# Patient Record
Sex: Male | Born: 1950 | Race: White | Hispanic: No | Marital: Married | Smoking: Former smoker
Health system: Southern US, Community
[De-identification: ages and names within clinical notes are randomized; demographics above are authoritative.]

## PROBLEM LIST (undated history)

## (undated) DIAGNOSIS — J449 Chronic obstructive pulmonary disease, unspecified: Secondary | ICD-10-CM

## (undated) DIAGNOSIS — Z8489 Family history of other specified conditions: Secondary | ICD-10-CM

## (undated) DIAGNOSIS — G4730 Sleep apnea, unspecified: Secondary | ICD-10-CM

## (undated) DIAGNOSIS — J45909 Unspecified asthma, uncomplicated: Secondary | ICD-10-CM

## (undated) DIAGNOSIS — J189 Pneumonia, unspecified organism: Secondary | ICD-10-CM

## (undated) DIAGNOSIS — I10 Essential (primary) hypertension: Secondary | ICD-10-CM

## (undated) DIAGNOSIS — K219 Gastro-esophageal reflux disease without esophagitis: Secondary | ICD-10-CM

## (undated) DIAGNOSIS — R0600 Dyspnea, unspecified: Secondary | ICD-10-CM

## (undated) DIAGNOSIS — M1990 Unspecified osteoarthritis, unspecified site: Secondary | ICD-10-CM

## (undated) HISTORY — PX: COLONOSCOPY: SHX174

## (undated) HISTORY — PX: BACK SURGERY: SHX140

## (undated) HISTORY — PX: ANAL FISSURE REPAIR: SHX2312

## (undated) HISTORY — PX: OTHER SURGICAL HISTORY: SHX169

---

## 2018-01-05 ENCOUNTER — Other Ambulatory Visit: Payer: Self-pay | Admitting: Neurosurgery

## 2018-01-05 DIAGNOSIS — M5412 Radiculopathy, cervical region: Secondary | ICD-10-CM

## 2018-01-11 ENCOUNTER — Ambulatory Visit
Admission: RE | Admit: 2018-01-11 | Discharge: 2018-01-11 | Disposition: A | Discharge status: Home / Self Care | Payer: Medicare Other | Source: Ambulatory Visit | Attending: Neurosurgery | Admitting: Neurosurgery

## 2018-01-11 ENCOUNTER — Other Ambulatory Visit: Payer: Self-pay | Admitting: Neurosurgery

## 2018-01-11 DIAGNOSIS — M5412 Radiculopathy, cervical region: Secondary | ICD-10-CM

## 2018-02-09 NOTE — H&P (Signed)
Patient ID:   [id number] Patient: Patrick Waters  Date of Birth: Dec 16, 1950 Visit Type: Office Visit   Date: 01/11/2018 09:00 AM Provider: Danae Orleans. Venetia Maxon MD   This 67 year old male presents for neck pain.   History of Present Illness: 1.  neck pain  Patient returns to review his MRI  I reviewed the patient's MRI of the cervical spine and also consulted with Dr. Danielle Dess to get his opinion.  There is cervical stenosis at C2-3 level with some cord compression but without cord signal abnormality.  In addition there is severe degeneration with anterolisthesis at C6-7 and C7-T1 levels .  He continues to have significant weakness in his right hand grip strength and I believe this is secondary to the foraminal stenosis at C7-T1 on the right.  Currently his right hand grip strength is 4-out of 5.   The patient is complaining of right shoulder and arm pain.  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  I do not believe that at the present time based on his examination , which does not show signs of cervical myelopathy, that it would make sense to do a posterior decompression surgery at the C 2 3 level and think that this should be monitored but not operated on at the present.  This was also Dr. Danielle Dess his opinion although he we both feel that the patient should continue to be followed every 6 months to make sure he does not develop signs of myelopathy.           PAST MEDICAL/SURGICAL HISTORY   (Reviewed, updated)  Disease/disorder Onset Date Management Date Comments    Surgery, lumbar spine 2013     Surgery, cervical spine 1998     Surgery, cervical spine 1991   COPD      Elevated lipids      Hypertension         PAST MEDICAL HISTORY,  SURGICAL HISTORY, FAMILY HISTORY, SOCIAL HISTORY AND REVIEW OF SYSTEMS I have reviewed the patient's past medical, surgical, family and social history as well as the comprehensive review of systems as included on the [facility] history form dated 01/04/2018, which I have signed.  Family History  (Reviewed, updated) Relationship Family Member Name Deceased Age at Death Condition Onset Age Cause of Death      Family history of Diabetes mellitus  N      Family history of Hypertension  N    Social History: Reviewed, no changes.   MEDICATIONS(added, continued or stopped this visit): Started Medication Directions Instruction Stopped   Aspirin Low Dose 81 mg tablet,delayed release take 1 tablet by oral route  every day     Benadryl 25 mg capsule take 2 capsule by oral route  every 4 - 6 hours as needed     Co Q-10 100 mg capsule      Flonase Allergy Relief 50 mcg/actuation nasal spray,suspension spray 1 - 2 spray by intranasal route  every day in each nostril as needed     HyQvia 5 gram/50 mL (10 %) subcutaneous solution      ipratropium-albuterol 0.5 mg-3 mg(2.5 mg base)/3 mL nebulization soln inhale 3 milliliter by nebulization route 4  times every day     levocetirizine 5 mg tablet take 1 tablet by oral route  every day in the evening     lisinopril 20 mg tablet take 1 tablet by oral route  every day     meloxicam 7.5 mg tablet take 1 tablet by oral route  every 2 days     montelukast 10 mg tablet take 1 tablet by oral route  every day in the evening     multivitamin tablet take 1 tablet by oral route  every day     omeprazole 20 mg tablet,delayed release take 1 capsule by oral route  every day    01/04/2018 Percocet 10 mg-325 mg tablet take 1 tablet by oral route  every 6 hours as needed     pravastatin 40 mg tablet take 1 tablet by oral route  every day     Symbicort 160 mcg-4.5 mcg/actuation HFA aerosol inhaler inhale 2 puff by inhalation route 2 times every  day in the morning and evening     Ventolin HFA 90 mcg/actuation aerosol inhaler inhale 2 puff by inhalation route  every 4 - 6 hours as needed     vitamin b12 take 1 tablet by mouth daily     Vitamin D3 5,000 unit tablet      Xolair 150 mg subcutaneous solution inject 1.2 milliliter by subcutaneous route  every 4 weeks       ALLERGIES: Ingredient Reaction Medication Name Comment  NO KNOWN ALLERGIES     No known allergies. Reviewed, no changes.    Vitals Date Temp F BP Pulse Ht In Wt Lb BMI BSA Pain Score  01/11/2018  142/82 72 70 251 36.01  5/10      IMPRESSION Right arm and hand weakness with significant pathology including stenosis and foraminal stenosis at the C6-7 and C7-T1 levels.   Completed Orders (this encounter) Order Details Reason Side Interpretation Result Initial Treatment Date Region  Hypertension education Patient to follow up with primary care provider.        Dietary management education, guidance, and counseling patient encouraged to eat a well balanced diet         Assessment/Plan # Detail Type Description   1. 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       2. Assessment Cervical stenosis of spinal canal (M48.02).   Plan Orders Vista Hard Collar Universal Se.       3. Assessment Cervical radiculopathy (M54.12).       4. Assessment Essential (primary) hypertension (I10).       5. Assessment Body mass index (BMI) 36.0-36.9, adult (Z68.36).   Plan Orders Today's instructions / counseling include(s) Dietary management education, guidance, and counseling.           Pain Management Plan Pain Scale: 5/10. Method: Numeric Pain Intensity Scale. Location: back. Onset: 11/22/2017. Duration: varies. Quality: discomforting. Pain management follow-up plan of care: Patient is taking OTC pain relievers for relief..  I have recommended to the patient that he undergo posterior decompression and fusion C6 through T1  levels.  He has been fitted for a Vista collar.  Risks and benefits were discussed in detail with patient and nurse education was provided.  Surgery scheduled for [facility]/14/2018 at [facility].  He wishes to proceed   Orders: Diagnostic Procedures: Assessment Procedure  M54.12 Cervical Spine- AP/Lat  Instruction(s)/Education: Assessment Instruction  I10 Hypertension education  Z68.36 Dietary management education, guidance, and counseling  Miscellaneous: Assessment  Z61.09 Vista Hard Collar Universal Se             Provider:  Venetia Maxon MD, Danae Orleans 01/14/2018 2:10 PM  Dictation edited by: Danae Orleans. Venetia Maxon    CC Providers: PCP  None   Maeola Harman MD  [address] [place], [place] [address]              Electronically signed by Danae Orleans. Venetia Maxon MD on 01/14/2018 02:10 PM

## 2018-02-13 ENCOUNTER — Other Ambulatory Visit: Payer: Self-pay

## 2018-02-13 ENCOUNTER — Encounter (HOSPITAL_COMMUNITY): Payer: Self-pay | Admitting: *Deleted

## 2018-02-13 NOTE — Progress Notes (Signed)
Mr Patrick Waters denies chest pain or shortness of breath.  Patient reported that he is on an antibiotic ordered by Pulmonologist, Dr Lanelle BalElnaggar at [facility]. Patient had a Echo and Stress Test done 09/2017, at [facility], patient said it was normal.  I have requested results.  I have given the information to Rica MastAngela Kabbe, NP.

## 2018-02-13 NOTE — Progress Notes (Signed)
Anesthesia Chart Review:  Pt is a same day work up.   Pt is a 67 year old male scheduled for C6-T1 posterior decompression and fusion on 02/14/2018 with Patrick HarmanJoseph Stern, MD  Providers:  - Pulmonologist is Patrick LarsenAhmed Elnaggar, MD. Pt reports recent visit during which he was prescribed omnicef (reason is unclear). Records requested.  - Pt saw ID Patrick HintBecky Smith, MD for possible pulmonary fungal infection this past fall (notes in care everywhere). Last office visit 10/24/17 documents "He has had normal CT imaging and his most recent cultures are more suggestive of environmental contaminants. Furthermore, he has had bx without evidence of invasive fungal infection. After discussion with pulmonary, his picture is most consistent with severe asthma and mild CVID which has been better since starting on hyquvia. He has had negative stress test. There are some opportunities for increase compliance with his inhalers and we could consider adding inhaled hypertonic saline to help with pulmonary toilet"   - Pt saw cardiologist Patrick SaleShannon [place]air, MD on 08/03/17 for SOB (notes in care everywhere). Note documents "As he did 2 years ago, he continues to blame most of his shortness of breath when his lung issues. I once again offered repeat stress testing or echocardiogram. These tests have previously been favorable. At this point in time he would like to focus on treatment of his lung disease. He will follow-up with his pulmonologist. I would be happy to proceed with the above-mentioned test if needed".  Pt reports he did have echo and stress test last fall that he says were normal; I am attempting to get records.    PMH includes:  Asthma, possible CVID, GERD. Former smoker (quit 1987). BMI 35.5  Medications include: Albuterol, ASA 81 mg, Symbicort, Omnicef, Mucinex DM, HyQvia, DuoNeb, lisinopril, Xolair, Prilosec, pravastatin.   Labs will be obtained day of surgery.  Nuclear stress test requested.   Echo 09/23/17  requested   Patient will need further assessment by assigned anesthesiologist day of surgery.  If no acute CV symptoms and no signs/symptoms of acute illness, anticipate patient can proceed as scheduled  Patrick Mastngela Trevontae Lindahl, FNP-BC [facility] Phone: [phone] 02/13/2018 4:45 PM

## 2018-02-14 ENCOUNTER — Inpatient Hospital Stay (HOSPITAL_COMMUNITY): Payer: Medicare Other

## 2018-02-14 ENCOUNTER — Inpatient Hospital Stay (HOSPITAL_COMMUNITY)
Admission: RE | Disposition: A | Discharge status: Home / Self Care | Payer: Self-pay | Source: Ambulatory Visit | Attending: Neurosurgery

## 2018-02-14 ENCOUNTER — Inpatient Hospital Stay (HOSPITAL_COMMUNITY)
Admission: RE | Admit: 2018-02-14 | Discharge: 2018-02-15 | DRG: 473 | Disposition: A | Discharge status: Home / Self Care | Payer: Medicare Other | Source: Ambulatory Visit | Attending: Neurosurgery | Admitting: Neurosurgery

## 2018-02-14 DIAGNOSIS — Z7951 Long term (current) use of inhaled steroids: Secondary | ICD-10-CM

## 2018-02-14 DIAGNOSIS — M542 Cervicalgia: Secondary | ICD-10-CM | POA: Diagnosis present

## 2018-02-14 DIAGNOSIS — K219 Gastro-esophageal reflux disease without esophagitis: Secondary | ICD-10-CM | POA: Diagnosis present

## 2018-02-14 DIAGNOSIS — M5412 Radiculopathy, cervical region: Secondary | ICD-10-CM | POA: Diagnosis present

## 2018-02-14 DIAGNOSIS — J45909 Unspecified asthma, uncomplicated: Secondary | ICD-10-CM | POA: Diagnosis present

## 2018-02-14 DIAGNOSIS — M4312 Spondylolisthesis, cervical region: Secondary | ICD-10-CM | POA: Diagnosis present

## 2018-02-14 DIAGNOSIS — M25511 Pain in right shoulder: Secondary | ICD-10-CM | POA: Diagnosis present

## 2018-02-14 DIAGNOSIS — M4802 Spinal stenosis, cervical region: Principal | ICD-10-CM | POA: Diagnosis present

## 2018-02-14 DIAGNOSIS — I10 Essential (primary) hypertension: Secondary | ICD-10-CM | POA: Diagnosis present

## 2018-02-14 DIAGNOSIS — Z419 Encounter for procedure for purposes other than remedying health state, unspecified: Secondary | ICD-10-CM

## 2018-02-14 DIAGNOSIS — Z79899 Other long term (current) drug therapy: Secondary | ICD-10-CM

## 2018-02-14 HISTORY — DX: Dyspnea, unspecified: R06.00

## 2018-02-14 HISTORY — DX: Family history of other specified conditions: Z84.89

## 2018-02-14 HISTORY — DX: Pneumonia, unspecified organism: J18.9

## 2018-02-14 HISTORY — DX: Unspecified osteoarthritis, unspecified site: M19.90

## 2018-02-14 HISTORY — DX: Gastro-esophageal reflux disease without esophagitis: K21.9

## 2018-02-14 HISTORY — DX: Unspecified asthma, uncomplicated: J45.909

## 2018-02-14 HISTORY — PX: POSTERIOR CERVICAL FUSION/FORAMINOTOMY: SHX5038

## 2018-02-14 LAB — CBC
HEMATOCRIT: 42.2 % (ref 39.0–52.0)
HEMOGLOBIN: 14.1 g/dL (ref 13.0–17.0)
MCH: 31.1 pg (ref 26.0–34.0)
MCHC: 33.4 g/dL (ref 30.0–36.0)
MCV: 93.2 fL (ref 78.0–100.0)
Platelets: 220 10*3/uL (ref 150–400)
RBC: 4.53 MIL/uL (ref 4.22–5.81)
RDW: 12.9 % (ref 11.5–15.5)
WBC: 8.3 10*3/uL (ref 4.0–10.5)

## 2018-02-14 LAB — BASIC METABOLIC PANEL
ANION GAP: 11 (ref 5–15)
BUN: 13 mg/dL (ref 6–20)
CHLORIDE: 101 mmol/L (ref 101–111)
CO2: 24 mmol/L (ref 22–32)
Calcium: 8.8 mg/dL — ABNORMAL LOW (ref 8.9–10.3)
Creatinine, Ser: 1.06 mg/dL (ref 0.61–1.24)
GFR calc Af Amer: >60 mL/min (ref >60)
GFR calc non Af Amer: >60 mL/min (ref >60)
GLUCOSE: 112 mg/dL — AB (ref 65–99)
Potassium: 4.1 mmol/L (ref 3.5–5.1)
Sodium: 136 mmol/L (ref 135–145)

## 2018-02-14 LAB — SURGICAL PCR SCREEN
MRSA, PCR: NEGATIVE
Staphylococcus aureus: NEGATIVE

## 2018-02-14 LAB — TYPE AND SCREEN
ABO/RH(D): O POS
ANTIBODY SCREEN: NEGATIVE

## 2018-02-14 LAB — ABO/RH: ABO/RH(D): O POS

## 2018-02-14 SURGERY — POSTERIOR CERVICAL FUSION/FORAMINOTOMY LEVEL 2
Anesthesia: General | Site: Back

## 2018-02-14 MED ORDER — EPHEDRINE SULFATE-NACL 50-0.9 MG/10ML-% IV SOSY
PREFILLED_SYRINGE | INTRAVENOUS | Status: DC | PRN
  Administered 2018-02-14: 10 mg via INTRAVENOUS

## 2018-02-14 MED ORDER — MORPHINE SULFATE (PF) 4 MG/ML IV SOLN
2.0000 mg | INTRAVENOUS | Status: DC | PRN
  Administered 2018-02-14 (×2): 2 mg via INTRAVENOUS
  Filled 2018-02-14 (×2): qty 1

## 2018-02-14 MED ORDER — METHOCARBAMOL 500 MG PO TABS
500.0000 mg | ORAL_TABLET | Freq: Four times a day (QID) | ORAL | Status: DC | PRN
  Administered 2018-02-14 – 2018-02-15 (×2): 500 mg via ORAL
  Filled 2018-02-14: qty 1

## 2018-02-14 MED ORDER — ROCURONIUM BROMIDE 10 MG/ML (PF) SYRINGE
PREFILLED_SYRINGE | INTRAVENOUS | Status: AC
  Filled 2018-02-14: qty 5

## 2018-02-14 MED ORDER — METHOCARBAMOL 500 MG PO TABS
ORAL_TABLET | ORAL | Status: AC
  Filled 2018-02-14: qty 1

## 2018-02-14 MED ORDER — GLYCOPYRROLATE 0.2 MG/ML IV SOSY
PREFILLED_SYRINGE | INTRAVENOUS | Status: DC | PRN
  Administered 2018-02-14: .2 mg via INTRAVENOUS

## 2018-02-14 MED ORDER — LIDOCAINE-EPINEPHRINE 1 %-1:100000 IJ SOLN
INTRAMUSCULAR | Status: DC | PRN
  Administered 2018-02-14: 10 mL

## 2018-02-14 MED ORDER — CEFAZOLIN SODIUM-DEXTROSE 2-4 GM/100ML-% IV SOLN
2.0000 g | Freq: Three times a day (TID) | INTRAVENOUS | Status: AC
  Administered 2018-02-14 – 2018-02-15 (×2): 2 g via INTRAVENOUS
  Filled 2018-02-14 (×2): qty 100

## 2018-02-14 MED ORDER — BUPIVACAINE HCL (PF) 0.5 % IJ SOLN
INTRAMUSCULAR | Status: DC | PRN
  Administered 2018-02-14: 10 mL

## 2018-02-14 MED ORDER — THROMBIN (RECOMBINANT) 5000 UNITS EX SOLR
OROMUCOSAL | Status: DC | PRN
  Administered 2018-02-14: 5 mL

## 2018-02-14 MED ORDER — CEFUROXIME AXETIL 500 MG PO TABS
500.0000 mg | ORAL_TABLET | Freq: Two times a day (BID) | ORAL | Status: DC
  Administered 2018-02-14 – 2018-02-15 (×2): 500 mg via ORAL
  Filled 2018-02-14 (×2): qty 1

## 2018-02-14 MED ORDER — 0.9 % SODIUM CHLORIDE (POUR BTL) OPTIME
TOPICAL | Status: DC | PRN
  Administered 2018-02-14 (×2): 1000 mL

## 2018-02-14 MED ORDER — PROPOFOL 10 MG/ML IV BOLUS
INTRAVENOUS | Status: DC | PRN
  Administered 2018-02-14: 100 mg via INTRAVENOUS
  Administered 2018-02-14: 50 mg via INTRAVENOUS

## 2018-02-14 MED ORDER — CHLORHEXIDINE GLUCONATE CLOTH 2 % EX PADS: 6.0000 | MEDICATED_PAD | Freq: Once | CUTANEOUS | Status: DC

## 2018-02-14 MED ORDER — MIDAZOLAM HCL 2 MG/2ML IJ SOLN
INTRAMUSCULAR | Status: AC
  Filled 2018-02-14: qty 2

## 2018-02-14 MED ORDER — PANTOPRAZOLE SODIUM 40 MG PO TBEC: 40.0000 mg | DELAYED_RELEASE_TABLET | Freq: Every day | ORAL | Status: DC

## 2018-02-14 MED ORDER — DIPHENHYDRAMINE HCL 25 MG PO CAPS
25.0000 mg | ORAL_CAPSULE | Freq: Four times a day (QID) | ORAL | Status: DC | PRN
Start: 2018-02-14 — End: 2018-02-15

## 2018-02-14 MED ORDER — PHENOL 1.4 % MT LIQD: 1.0000 | OROMUCOSAL | Status: DC | PRN

## 2018-02-14 MED ORDER — FENTANYL CITRATE (PF) 250 MCG/5ML IJ SOLN
INTRAMUSCULAR | Status: DC | PRN
  Administered 2018-02-14 (×3): 50 ug via INTRAVENOUS
  Administered 2018-02-14: 100 ug via INTRAVENOUS

## 2018-02-14 MED ORDER — ZOLPIDEM TARTRATE 5 MG PO TABS: 5.0000 mg | ORAL_TABLET | Freq: Every evening | ORAL | Status: DC | PRN

## 2018-02-14 MED ORDER — ACETAMINOPHEN 325 MG PO TABS
ORAL_TABLET | ORAL | Status: AC
  Filled 2018-02-14: qty 2

## 2018-02-14 MED ORDER — OXYCODONE HCL 5 MG PO TABS
5.0000 mg | ORAL_TABLET | ORAL | Status: DC | PRN
  Administered 2018-02-14: 5 mg via ORAL

## 2018-02-14 MED ORDER — CO Q 10 100 MG PO CAPS
100.0000 mg | ORAL_CAPSULE | Freq: Every day | ORAL | Status: DC
Start: 2018-02-14 — End: 2018-02-14

## 2018-02-14 MED ORDER — ONDANSETRON HCL 4 MG/2ML IJ SOLN: 4.0000 mg | Freq: Once | INTRAMUSCULAR | Status: DC | PRN

## 2018-02-14 MED ORDER — CETIRIZINE HCL 10 MG PO TABS
5.0000 mg | ORAL_TABLET | Freq: Every evening | ORAL | Status: DC
  Administered 2018-02-14: 5 mg via ORAL
  Filled 2018-02-14: qty 1

## 2018-02-14 MED ORDER — HEMOSTATIC AGENTS (NO CHARGE) OPTIME
TOPICAL | Status: DC | PRN
  Administered 2018-02-14: 1

## 2018-02-14 MED ORDER — FENTANYL CITRATE (PF) 250 MCG/5ML IJ SOLN
INTRAMUSCULAR | Status: AC
  Filled 2018-02-14: qty 5

## 2018-02-14 MED ORDER — FLUTICASONE PROPIONATE 50 MCG/ACT NA SUSP
1.0000 | Freq: Two times a day (BID) | NASAL | Status: DC
Start: 2018-02-14 — End: 2018-02-15
  Administered 2018-02-14 – 2018-02-15 (×2): 1 via NASAL
  Filled 2018-02-14: qty 16

## 2018-02-14 MED ORDER — IPRATROPIUM-ALBUTEROL 0.5-2.5 (3) MG/3ML IN SOLN
3.0000 mL | Freq: Three times a day (TID) | RESPIRATORY_TRACT | Status: DC
  Administered 2018-02-14 – 2018-02-15 (×3): 3 mL via RESPIRATORY_TRACT
  Filled 2018-02-14 (×3): qty 3

## 2018-02-14 MED ORDER — IMMUNE GLOBULIN-HYALURONIDASE 10 GM/100ML ~~LOC~~ KIT
50.0000 g | PACK | SUBCUTANEOUS | Status: DC
Start: 2018-02-14 — End: 2018-02-14

## 2018-02-14 MED ORDER — ROCURONIUM BROMIDE 10 MG/ML (PF) SYRINGE
PREFILLED_SYRINGE | INTRAVENOUS | Status: DC | PRN
  Administered 2018-02-14: 20 mg via INTRAVENOUS
  Administered 2018-02-14: 50 mg via INTRAVENOUS
  Administered 2018-02-14: 30 mg via INTRAVENOUS
  Administered 2018-02-14: 20 mg via INTRAVENOUS

## 2018-02-14 MED ORDER — PROPOFOL 10 MG/ML IV BOLUS
INTRAVENOUS | Status: AC
  Filled 2018-02-14: qty 20

## 2018-02-14 MED ORDER — DM-GUAIFENESIN ER 30-600 MG PO TB12
1.0000 | ORAL_TABLET | Freq: Two times a day (BID) | ORAL | Status: DC
  Administered 2018-02-14 – 2018-02-15 (×2): 1 via ORAL
  Filled 2018-02-14 (×3): qty 1

## 2018-02-14 MED ORDER — PANTOPRAZOLE SODIUM 40 MG IV SOLR
40.0000 mg | Freq: Every day | INTRAVENOUS | Status: DC
  Administered 2018-02-14: 40 mg via INTRAVENOUS
  Filled 2018-02-14: qty 40

## 2018-02-14 MED ORDER — OXYCODONE-ACETAMINOPHEN 10-325 MG PO TABS: 1.0000 | ORAL_TABLET | Freq: Four times a day (QID) | ORAL | Status: DC | PRN

## 2018-02-14 MED ORDER — ONDANSETRON HCL 4 MG PO TABS: 4.0000 mg | ORAL_TABLET | Freq: Four times a day (QID) | ORAL | Status: DC | PRN

## 2018-02-14 MED ORDER — POLYETHYLENE GLYCOL 3350 17 G PO PACK: 17.0000 g | PACK | Freq: Every day | ORAL | Status: DC | PRN

## 2018-02-14 MED ORDER — DEXAMETHASONE SODIUM PHOSPHATE 10 MG/ML IJ SOLN
INTRAMUSCULAR | Status: DC | PRN
  Administered 2018-02-14: 10 mg via INTRAVENOUS

## 2018-02-14 MED ORDER — ACETAMINOPHEN 650 MG RE SUPP: 650.0000 mg | RECTAL | Status: DC | PRN

## 2018-02-14 MED ORDER — SUGAMMADEX SODIUM 500 MG/5ML IV SOLN
INTRAVENOUS | Status: AC
  Filled 2018-02-14: qty 5

## 2018-02-14 MED ORDER — BUPIVACAINE HCL (PF) 0.5 % IJ SOLN
INTRAMUSCULAR | Status: AC
  Filled 2018-02-14: qty 30

## 2018-02-14 MED ORDER — SODIUM CHLORIDE 0.9% FLUSH
3.0000 mL | Freq: Two times a day (BID) | INTRAVENOUS | Status: DC
  Administered 2018-02-14: 3 mL via INTRAVENOUS

## 2018-02-14 MED ORDER — ASPIRIN EC 81 MG PO TBEC
81.0000 mg | DELAYED_RELEASE_TABLET | Freq: Every day | ORAL | Status: DC
  Administered 2018-02-14 – 2018-02-15 (×2): 81 mg via ORAL
  Filled 2018-02-14 (×2): qty 1

## 2018-02-14 MED ORDER — MENTHOL 3 MG MT LOZG
1.0000 | LOZENGE | OROMUCOSAL | Status: DC | PRN
  Filled 2018-02-14: qty 9

## 2018-02-14 MED ORDER — FENTANYL CITRATE (PF) 100 MCG/2ML IJ SOLN
INTRAMUSCULAR | Status: AC
  Filled 2018-02-14: qty 2

## 2018-02-14 MED ORDER — MOMETASONE FURO-FORMOTEROL FUM 200-5 MCG/ACT IN AERO
2.0000 | INHALATION_SPRAY | Freq: Two times a day (BID) | RESPIRATORY_TRACT | Status: DC
  Administered 2018-02-14 – 2018-02-15 (×2): 2 via RESPIRATORY_TRACT
  Filled 2018-02-14: qty 8.8

## 2018-02-14 MED ORDER — HYDROMORPHONE HCL 1 MG/ML IJ SOLN
0.2500 mg | INTRAMUSCULAR | Status: DC | PRN
  Administered 2018-02-14 (×4): 0.5 mg via INTRAVENOUS

## 2018-02-14 MED ORDER — ALUM & MAG HYDROXIDE-SIMETH 200-200-20 MG/5ML PO SUSP: 30.0000 mL | Freq: Four times a day (QID) | ORAL | Status: DC | PRN

## 2018-02-14 MED ORDER — ACETAMINOPHEN 325 MG PO TABS
650.0000 mg | ORAL_TABLET | ORAL | Status: DC | PRN
  Administered 2018-02-14: 650 mg via ORAL

## 2018-02-14 MED ORDER — BISACODYL 10 MG RE SUPP: 10.0000 mg | Freq: Every day | RECTAL | Status: DC | PRN

## 2018-02-14 MED ORDER — ADULT MULTIVITAMIN LIQUID CH
15.0000 mL | Freq: Every day | ORAL | Status: DC
  Filled 2018-02-14 (×2): qty 15

## 2018-02-14 MED ORDER — LACTATED RINGERS IV SOLN
INTRAVENOUS | Status: DC
  Administered 2018-02-14: 50 mL/h via INTRAVENOUS
  Administered 2018-02-14: 12:00:00 via INTRAVENOUS

## 2018-02-14 MED ORDER — HYDROCODONE-ACETAMINOPHEN 5-325 MG PO TABS: 2.0000 | ORAL_TABLET | ORAL | Status: DC | PRN

## 2018-02-14 MED ORDER — OXYCODONE HCL 5 MG PO TABS
ORAL_TABLET | ORAL | Status: AC
  Filled 2018-02-14: qty 1

## 2018-02-14 MED ORDER — BACITRACIN ZINC 500 UNIT/GM EX OINT
TOPICAL_OINTMENT | CUTANEOUS | Status: AC
  Filled 2018-02-14: qty 28.35

## 2018-02-14 MED ORDER — METHOCARBAMOL 1000 MG/10ML IJ SOLN
500.0000 mg | Freq: Four times a day (QID) | INTRAVENOUS | Status: DC | PRN
  Administered 2018-02-15: 500 mg via INTRAVENOUS
  Filled 2018-02-14: qty 5
  Filled 2018-02-14: qty 550

## 2018-02-14 MED ORDER — THROMBIN (RECOMBINANT) 5000 UNITS EX SOLR
CUTANEOUS | Status: DC | PRN
  Administered 2018-02-14 (×2): 5000 [IU] via TOPICAL

## 2018-02-14 MED ORDER — MEPERIDINE HCL 50 MG/ML IJ SOLN: 6.2500 mg | INTRAMUSCULAR | Status: DC | PRN

## 2018-02-14 MED ORDER — VITAMIN D 1000 UNITS PO TABS
5000.0000 [IU] | ORAL_TABLET | Freq: Every day | ORAL | Status: DC
  Administered 2018-02-15: 5000 [IU] via ORAL
  Filled 2018-02-14 (×3): qty 5

## 2018-02-14 MED ORDER — DIAZEPAM 5 MG PO TABS
ORAL_TABLET | ORAL | Status: AC
  Filled 2018-02-14: qty 1

## 2018-02-14 MED ORDER — LIDOCAINE-EPINEPHRINE 1 %-1:100000 IJ SOLN
INTRAMUSCULAR | Status: AC
  Filled 2018-02-14: qty 1

## 2018-02-14 MED ORDER — KCL IN DEXTROSE-NACL 20-5-0.45 MEQ/L-%-% IV SOLN: INTRAVENOUS | Status: DC

## 2018-02-14 MED ORDER — THROMBIN 5000 UNITS EX SOLR
CUTANEOUS | Status: AC
  Filled 2018-02-14: qty 15000

## 2018-02-14 MED ORDER — ALBUTEROL SULFATE (2.5 MG/3ML) 0.083% IN NEBU: 2.5000 mg | INHALATION_SOLUTION | Freq: Four times a day (QID) | RESPIRATORY_TRACT | Status: DC | PRN

## 2018-02-14 MED ORDER — LIDOCAINE 2% (20 MG/ML) 5 ML SYRINGE
INTRAMUSCULAR | Status: DC | PRN
  Administered 2018-02-14: 100 mg via INTRAVENOUS

## 2018-02-14 MED ORDER — OXYCODONE-ACETAMINOPHEN 5-325 MG PO TABS: 1.0000 | ORAL_TABLET | ORAL | Status: DC | PRN

## 2018-02-14 MED ORDER — DOCUSATE SODIUM 100 MG PO CAPS
100.0000 mg | ORAL_CAPSULE | Freq: Two times a day (BID) | ORAL | Status: DC
  Administered 2018-02-14 – 2018-02-15 (×3): 100 mg via ORAL
  Filled 2018-02-14 (×3): qty 1

## 2018-02-14 MED ORDER — CEFAZOLIN SODIUM-DEXTROSE 2-4 GM/100ML-% IV SOLN
2.0000 g | INTRAVENOUS | Status: AC
  Administered 2018-02-14: 2 g via INTRAVENOUS
  Filled 2018-02-14: qty 100

## 2018-02-14 MED ORDER — VITAMIN B-12 1000 MCG PO TABS
5000.0000 ug | ORAL_TABLET | Freq: Every day | ORAL | Status: DC
  Administered 2018-02-15: 5000 ug via ORAL
  Filled 2018-02-14: qty 5

## 2018-02-14 MED ORDER — BACITRACIN ZINC 500 UNIT/GM EX OINT
TOPICAL_OINTMENT | CUTANEOUS | Status: DC | PRN
  Administered 2018-02-14: 1 via TOPICAL

## 2018-02-14 MED ORDER — LISINOPRIL 20 MG PO TABS
20.0000 mg | ORAL_TABLET | Freq: Every day | ORAL | Status: DC
  Administered 2018-02-14 – 2018-02-15 (×2): 20 mg via ORAL
  Filled 2018-02-14 (×2): qty 1

## 2018-02-14 MED ORDER — HYDROMORPHONE HCL 1 MG/ML IJ SOLN
INTRAMUSCULAR | Status: AC
  Filled 2018-02-14: qty 1

## 2018-02-14 MED ORDER — OXYCODONE HCL 5 MG PO TABS
15.0000 mg | ORAL_TABLET | ORAL | Status: DC | PRN
  Administered 2018-02-14 – 2018-02-15 (×4): 15 mg via ORAL
  Filled 2018-02-14 (×4): qty 3

## 2018-02-14 MED ORDER — ONDANSETRON HCL 4 MG/2ML IJ SOLN: 4.0000 mg | Freq: Four times a day (QID) | INTRAMUSCULAR | Status: DC | PRN

## 2018-02-14 MED ORDER — SUGAMMADEX SODIUM 200 MG/2ML IV SOLN
INTRAVENOUS | Status: DC | PRN
  Administered 2018-02-14: 400 mg via INTRAVENOUS

## 2018-02-14 MED ORDER — SODIUM CHLORIDE 0.9% FLUSH: 3.0000 mL | INTRAVENOUS | Status: DC | PRN

## 2018-02-14 MED ORDER — PHENYLEPHRINE HCL 10 MG/ML IJ SOLN
INTRAVENOUS | Status: DC | PRN
  Administered 2018-02-14: 10 ug/min via INTRAVENOUS

## 2018-02-14 MED ORDER — LIDOCAINE HCL (CARDIAC) 20 MG/ML IV SOLN
INTRAVENOUS | Status: AC
  Filled 2018-02-14: qty 5

## 2018-02-14 MED ORDER — DEXAMETHASONE SODIUM PHOSPHATE 10 MG/ML IJ SOLN
INTRAMUSCULAR | Status: AC
  Filled 2018-02-14: qty 1

## 2018-02-14 MED ORDER — DIAZEPAM 5 MG PO TABS
5.0000 mg | ORAL_TABLET | Freq: Four times a day (QID) | ORAL | Status: DC | PRN
  Administered 2018-02-14 – 2018-02-15 (×3): 5 mg via ORAL
  Filled 2018-02-14 (×2): qty 1

## 2018-02-14 MED ORDER — EPHEDRINE 5 MG/ML INJ
INTRAVENOUS | Status: AC
  Filled 2018-02-14: qty 10

## 2018-02-14 MED ORDER — METHYLPREDNISOLONE ACETATE 80 MG/ML IJ SUSP
INTRAMUSCULAR | Status: AC
  Filled 2018-02-14: qty 1

## 2018-02-14 MED ORDER — PRAVASTATIN SODIUM 40 MG PO TABS
40.0000 mg | ORAL_TABLET | Freq: Every evening | ORAL | Status: DC
  Administered 2018-02-14: 40 mg via ORAL
  Filled 2018-02-14: qty 1

## 2018-02-14 MED ORDER — MELOXICAM 7.5 MG PO TABS
7.5000 mg | ORAL_TABLET | Freq: Every evening | ORAL | Status: DC
  Administered 2018-02-14: 7.5 mg via ORAL
  Filled 2018-02-14: qty 1

## 2018-02-14 MED ORDER — MONTELUKAST SODIUM 10 MG PO TABS
10.0000 mg | ORAL_TABLET | Freq: Every evening | ORAL | Status: DC
  Administered 2018-02-14: 10 mg via ORAL
  Filled 2018-02-14: qty 1

## 2018-02-14 MED ORDER — ONDANSETRON HCL 4 MG/2ML IJ SOLN
INTRAMUSCULAR | Status: AC
  Filled 2018-02-14: qty 2

## 2018-02-14 MED ORDER — ESMOLOL HCL 100 MG/10ML IV SOLN
INTRAVENOUS | Status: AC
  Filled 2018-02-14: qty 10

## 2018-02-14 MED ORDER — ONDANSETRON HCL 4 MG/2ML IJ SOLN
INTRAMUSCULAR | Status: DC | PRN
  Administered 2018-02-14: 4 mg via INTRAVENOUS

## 2018-02-14 MED ORDER — MIDAZOLAM HCL 2 MG/2ML IJ SOLN
INTRAMUSCULAR | Status: DC | PRN
  Administered 2018-02-14: 2 mg via INTRAVENOUS

## 2018-02-14 SURGICAL SUPPLY — 84 items
ADH SKN CLS APL DERMABOND .7 (GAUZE/BANDAGES/DRESSINGS) ×1
BIT DRILL NEURO 2X3.1 SFT TUCH (MISCELLANEOUS) IMPLANT
BIT DRILL VUEPOINT II (BIT) IMPLANT
BLADE CLIPPER SURG (BLADE) IMPLANT
BLADE SURG 11 STRL SS (BLADE) IMPLANT
BLADE ULTRA TIP 2M (BLADE) IMPLANT
BUR PRECISION FLUTE 5.0 (BURR) ×2 IMPLANT
CANISTER SUCT 3000ML PPV (MISCELLANEOUS) ×3 IMPLANT
CARTRIDGE OIL MAESTRO DRILL (MISCELLANEOUS) ×1 IMPLANT
DECANTER SPIKE VIAL GLASS SM (MISCELLANEOUS) ×3 IMPLANT
DERMABOND ADVANCED (GAUZE/BANDAGES/DRESSINGS) ×2
DERMABOND ADVANCED .7 DNX12 (GAUZE/BANDAGES/DRESSINGS) IMPLANT
DIFFUSER DRILL AIR PNEUMATIC (MISCELLANEOUS) ×3 IMPLANT
DRAPE C-ARM 42X72 X-RAY (DRAPES) ×6 IMPLANT
DRAPE C-ARMOR (DRAPES) ×2 IMPLANT
DRAPE LAPAROTOMY 100X72 PEDS (DRAPES) ×3 IMPLANT
DRAPE MICROSCOPE LEICA (MISCELLANEOUS) ×2 IMPLANT
DRILL BIT VUEPOINT II (BIT) ×3
DRILL NEURO 2X3.1 SOFT TOUCH (MISCELLANEOUS)
DRSG OPSITE POSTOP 4X6 (GAUZE/BANDAGES/DRESSINGS) ×2 IMPLANT
DRSG OPSITE POSTOP 4X8 (GAUZE/BANDAGES/DRESSINGS) ×2 IMPLANT
DRSG PAD ABDOMINAL 8X10 ST (GAUZE/BANDAGES/DRESSINGS) IMPLANT
DURAPREP 6ML APPLICATOR 50/CS (WOUND CARE) ×3 IMPLANT
ELECT BLADE 4.0 EZ CLEAN MEGAD (MISCELLANEOUS) ×3
ELECT REM PT RETURN 9FT ADLT (ELECTROSURGICAL) ×3
ELECTRODE BLDE 4.0 EZ CLN MEGD (MISCELLANEOUS) IMPLANT
ELECTRODE REM PT RTRN 9FT ADLT (ELECTROSURGICAL) ×1 IMPLANT
GAUZE SPONGE 4X4 12PLY STRL (GAUZE/BANDAGES/DRESSINGS) ×3 IMPLANT
GAUZE SPONGE 4X4 16PLY XRAY LF (GAUZE/BANDAGES/DRESSINGS) IMPLANT
GLOVE BIO SURGEON STRL SZ8 (GLOVE) ×3 IMPLANT
GLOVE BIOGEL PI IND STRL 6.5 (GLOVE) IMPLANT
GLOVE BIOGEL PI IND STRL 8 (GLOVE) ×1 IMPLANT
GLOVE BIOGEL PI IND STRL 8.5 (GLOVE) ×1 IMPLANT
GLOVE BIOGEL PI INDICATOR 6.5 (GLOVE) ×2
GLOVE BIOGEL PI INDICATOR 8 (GLOVE) ×8
GLOVE BIOGEL PI INDICATOR 8.5 (GLOVE) ×2
GLOVE ECLIPSE 7.5 STRL STRAW (GLOVE) ×8 IMPLANT
GLOVE ECLIPSE 8.0 STRL XLNG CF (GLOVE) ×3 IMPLANT
GLOVE EXAM NITRILE LRG STRL (GLOVE) IMPLANT
GLOVE EXAM NITRILE XL STR (GLOVE) IMPLANT
GLOVE EXAM NITRILE XS STR PU (GLOVE) IMPLANT
GOWN STRL REUS W/ TWL LRG LVL3 (GOWN DISPOSABLE) IMPLANT
GOWN STRL REUS W/ TWL XL LVL3 (GOWN DISPOSABLE) IMPLANT
GOWN STRL REUS W/TWL 2XL LVL3 (GOWN DISPOSABLE) ×4 IMPLANT
GOWN STRL REUS W/TWL LRG LVL3 (GOWN DISPOSABLE)
GOWN STRL REUS W/TWL XL LVL3 (GOWN DISPOSABLE) ×6
HEMOSTAT POWDER KIT SURGIFOAM (HEMOSTASIS) ×2 IMPLANT
HEMOSTAT SURGICEL 2X14 (HEMOSTASIS) IMPLANT
KIT BASIN OR (CUSTOM PROCEDURE TRAY) ×3 IMPLANT
KIT ROOM TURNOVER OR (KITS) ×3 IMPLANT
MARKER SKIN DUAL TIP RULER LAB (MISCELLANEOUS) ×3 IMPLANT
NDL HYPO 18GX1.5 BLUNT FILL (NEEDLE) IMPLANT
NDL HYPO 25X1 1.5 SAFETY (NEEDLE) ×1 IMPLANT
NDL SPNL 22GX3.5 QUINCKE BK (NEEDLE) ×1 IMPLANT
NEEDLE HYPO 18GX1.5 BLUNT FILL (NEEDLE) IMPLANT
NEEDLE HYPO 25X1 1.5 SAFETY (NEEDLE) ×3 IMPLANT
NEEDLE SPNL 22GX3.5 QUINCKE BK (NEEDLE) ×3 IMPLANT
NS IRRIG 1000ML POUR BTL (IV SOLUTION) ×3 IMPLANT
OIL CARTRIDGE MAESTRO DRILL (MISCELLANEOUS) ×3
PACK LAMINECTOMY NEURO (CUSTOM PROCEDURE TRAY) ×3 IMPLANT
PIN MAYFIELD SKULL DISP (PIN) ×3 IMPLANT
PIN SKULL STERILE RADIO DISP (PIN) ×2 IMPLANT
PUTTY BONE ATTRAX 5CC STRIP (Putty) ×2 IMPLANT
ROD 120MM (Rod) ×3 IMPLANT
ROD SPNL 120X3.5XNS LF TI (Rod) IMPLANT
RUBBERBAND STERILE (MISCELLANEOUS) ×4 IMPLANT
SCREW MA MM 3.5X12 (Screw) ×8 IMPLANT
SCREW SET THREADED (Screw) ×12 IMPLANT
SCREW VUEPOINT II 4.0X30MM MA (Screw) ×4 IMPLANT
SPONGE INTESTINAL PEANUT (DISPOSABLE) IMPLANT
SPONGE SURGIFOAM ABS GEL SZ50 (HEMOSTASIS) ×3 IMPLANT
STAPLER SKIN PROX WIDE 3.9 (STAPLE) ×3 IMPLANT
SUT ETHILON 3 0 FSL (SUTURE) ×3 IMPLANT
SUT VIC AB 0 CT1 18XCR BRD8 (SUTURE) ×1 IMPLANT
SUT VIC AB 0 CT1 8-18 (SUTURE) ×3
SUT VIC AB 2-0 CP2 18 (SUTURE) ×3 IMPLANT
SUT VIC AB 2-0 CT1 18 (SUTURE) ×2 IMPLANT
SUT VIC AB 3-0 SH 8-18 (SUTURE) ×4 IMPLANT
SYR 3ML LL SCALE MARK (SYRINGE) IMPLANT
TOWEL GREEN STERILE (TOWEL DISPOSABLE) ×3 IMPLANT
TOWEL GREEN STERILE FF (TOWEL DISPOSABLE) ×3 IMPLANT
TRAY FOLEY W/METER SILVER 16FR (SET/KITS/TRAYS/PACK) ×2 IMPLANT
UNDERPAD 30X30 (UNDERPADS AND DIAPERS) ×3 IMPLANT
WATER STERILE IRR 1000ML POUR (IV SOLUTION) ×3 IMPLANT

## 2018-02-14 NOTE — Anesthesia Preprocedure Evaluation (Signed)
Anesthesia Evaluation  Patient identified by MRN, date of birth, ID band Patient awake    Reviewed: Allergy & Precautions, NPO status , Patient's Chart, lab work & pertinent test results  Airway Mallampati: I  TM Distance: >3 FB Neck ROM: Full    Dental   Pulmonary asthma , former smoker,    Pulmonary exam normal        Cardiovascular Normal cardiovascular exam     Neuro/Psych    GI/Hepatic GERD  Medicated and Controlled,  Endo/Other    Renal/GU      Musculoskeletal   Abdominal   Peds  Hematology   Anesthesia Other Findings   Reproductive/Obstetrics                             Anesthesia Physical Anesthesia Plan  ASA: II  Anesthesia Plan: General   Post-op Pain Management:    Induction:   PONV Risk Score and Plan: 2 and Dexamethasone, Ondansetron and Midazolam  Airway Management Planned: Oral ETT  Additional Equipment:   Intra-op Plan:   Post-operative Plan: Extubation in OR  Informed Consent: I have reviewed the patients History and Physical, chart, labs and discussed the procedure including the risks, benefits and alternatives for the proposed anesthesia with the patient or authorized representative who has indicated his/her understanding and acceptance.     Plan Discussed with: CRNA and Surgeon  Anesthesia Plan Comments:         Anesthesia Quick Evaluation

## 2018-02-14 NOTE — Op Note (Signed)
02/14/2018  1:24 PM  PATIENT:  Patrick Waters  67 y.o. 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, foraminal stenosis, radiculopathy, cervicalgia  PROCEDURE:  Procedure(s): Cervical six-Thoracic one Posterior decompression and fusion (N/A) with lateral mass screws C 6, C 7 and pedicle screws T 1 with foraminotomy and microdissection  SURGEON:  Surgeon(s) and Role:    Maeola Harman* Handsome Anglin, MD - Primary    * Julio SicksPool, Henry, MD - Assisting  PHYSICIAN ASSISTANT:   ASSISTANTS: Poteat, RN   ANESTHESIA:   general  EBL:  200 mL   BLOOD ADMINISTERED:none  DRAINS: none   LOCAL MEDICATIONS USED:  MARCAINE    and LIDOCAINE   SPECIMEN:  No Specimen  DISPOSITION OF SPECIMEN:  N/A  COUNTS:  YES  TOURNIQUET:  * No tourniquets in log *  DICTATION: Patient is 67 year old man with spondylolisthesis C 7 T 1 with foraminal stenosis and right upper extremity weakness, cervical stenosis, and neck pain.  It was elected to take the patient to surgery for posterior cervical decompression and fusion, given patient's large size, it was felt this would not be possible from anterior approach.  PROCEDURE: Patient was brought to the OR and following smooth and uncomplicated induction of GETA using Glide Scope, patient was placed in 3 pin fixation and rolled into a prone position on the OR table with radiolucent headholder.  Posterior neck was shaved with clippers, prepped and draped in the usual fashion with betadine scrub followed by Duraprep.  Area of planned incision was infiltrated with local lidocaine.  Incision was made from C5-T1 and carried through the avascular midline plane to expose these lavels and their lateral masses.  There was significant arthritis at each of these levels with facet arthropathy and bony excrescences. Pedicle  screws were placed at T 1 bilaterally (30 x 4 mm) after AP and lateral localizing X rays.   Lateral mass screws were placed from C6 to C7 bilaterally according to standard landmarks and their positioning was confirmed with fluoroscopy (3.5 x 12 mm).  The facet joints and laminae were decorticated.  Screws and rods were locked down in situ.  A generous foraminotomy was performed with the high speed drill and with the microscope on the right at the C 7 T 1 level.  l The spinal cord dura appeared to be pulsatile and the C 8 nerve root was widely decompressed. Rods were placed bilaterally and locked down in situ.  Posterolateral region was packed with local autograft and 5 cc Attrax.   Hemostasis was assured  Wound was closed with 0, 2-0, and 3-0 vicryl sutures and staples were used to re approximate the skin edges. Sterile occlusive dressing was placed.  Patient was taken out of pins and turned back onto the OR table.  Patient was extubated in the operating room and taken to recovery in stable and satisfactory condition.  Counts were correct at the end of the case.  PLAN OF CARE: Admit to inpatient   PATIENT DISPOSITION:  PACU - hemodynamically stable.   Delay start of Pharmacological VTE agent (>24hrs) due to surgical blood loss or risk of bleeding: yes

## 2018-02-14 NOTE — Transfer of Care (Signed)
Immediate Anesthesia Transfer of Care Note  Patient: Patrick Waters  Procedure(s) Performed: Cervical six-Thoracic one Posterior decompression and fusion (N/A Back)  Patient Location: PACU  Anesthesia Type:General  Level of Consciousness: awake, alert  and oriented  Airway & Oxygen Therapy: Patient Spontanous Breathing and Patient connected to nasal cannula oxygen  Post-op Assessment: Report given to RN and Post -op Vital signs reviewed and stable  Post vital signs: Reviewed and stable  Last Vitals:  Vitals Value Taken Time  BP 156/79 02/14/2018  1:30 PM  Temp 36.2 C 02/14/2018  1:30 PM  Pulse 78 02/14/2018  1:40 PM  Resp 13 02/14/2018  1:40 PM  SpO2 100 % 02/14/2018  1:40 PM  Vitals shown include unvalidated device data.  Last Pain:  Vitals:   02/14/18 1330  TempSrc:   PainSc: 8       Patients Stated Pain Goal: 3 (02/14/18 0845)  Complications: No apparent anesthesia complications

## 2018-02-14 NOTE — Progress Notes (Signed)
Awake, alert, conversant.  MAEW with full strength.  Improved right HI.  Right arm pain is better.  Neck is sore.  Doing well.

## 2018-02-14 NOTE — Interval H&P Note (Signed)
History and Physical Interval Note:  02/14/2018 9:14 AM  Patrick Waters  has presented today for surgery, with the diagnosis of Cervical stenosis of spinal canal  The various methods of treatment have been discussed with the patient and family. After consideration of risks, benefits and other options for treatment, the patient has consented to  Procedure(s) with comments: C6-T1 Posterior decompression and fusion (N/A) - C6-T1 Posterior decompression and fusion as a surgical intervention .  The patient's history has been reviewed, patient examined, no change in status, stable for surgery.  I have reviewed the patient's chart and labs.  Questions were answered to the patient's satisfaction.     Ulla Mckiernan D

## 2018-02-14 NOTE — Brief Op Note (Signed)
02/14/2018  1:24 PM  PATIENT:  Patrick Waters  67 y.o. 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, foraminal stenosis, radiculopathy, cervicalgia  PROCEDURE:  Procedure(s): Cervical six-Thoracic one Posterior decompression and fusion (N/A) with lateral mass screws C 6, C 7 and pedicle screws T 1 with foraminotomy and microdissection  SURGEON:  Surgeon(s) and Role:    * Seng Fouts, MD - Primary    * Pool, Henry, MD - Assisting  PHYSICIAN ASSISTANT:   ASSISTANTS: Poteat, RN   ANESTHESIA:   general  EBL:  200 mL   BLOOD ADMINISTERED:none  DRAINS: none   LOCAL MEDICATIONS USED:  MARCAINE    and LIDOCAINE   SPECIMEN:  No Specimen  DISPOSITION OF SPECIMEN:  N/A  COUNTS:  YES  TOURNIQUET:  * No tourniquets in log *  DICTATION: Patient is 67 year old man with spondylolisthesis C 7 T 1 with foraminal stenosis and right upper extremity weakness, cervical stenosis, and neck pain.  It was elected to take the patient to surgery for posterior cervical decompression and fusion, given patient's large size, it was felt this would not be possible from anterior approach.  PROCEDURE: Patient was brought to the OR and following smooth and uncomplicated induction of GETA using Glide Scope, patient was placed in 3 pin fixation and rolled into a prone position on the OR table with radiolucent headholder.  Posterior neck was shaved with clippers, prepped and draped in the usual fashion with betadine scrub followed by Duraprep.  Area of planned incision was infiltrated with local lidocaine.  Incision was made from C5-T1 and carried through the avascular midline plane to expose these lavels and their lateral masses.  There was significant arthritis at each of these levels with facet arthropathy and bony excrescences. Pedicle  screws were placed at T 1 bilaterally (30 x 4 mm) after AP and lateral localizing X rays.   Lateral mass screws were placed from C6 to C7 bilaterally according to standard landmarks and their positioning was confirmed with fluoroscopy (3.5 x 12 mm).  The facet joints and laminae were decorticated.  Screws and rods were locked down in situ.  A generous foraminotomy was performed with the high speed drill and with the microscope on the right at the C 7 T 1 level.  l The spinal cord dura appeared to be pulsatile and the C 8 nerve root was widely decompressed. Rods were placed bilaterally and locked down in situ.  Posterolateral region was packed with local autograft and 5 cc Attrax.   Hemostasis was assured  Wound was closed with 0, 2-0, and 3-0 vicryl sutures and staples were used to re approximate the skin edges. Sterile occlusive dressing was placed.  Patient was taken out of pins and turned back onto the OR table.  Patient was extubated in the operating room and taken to recovery in stable and satisfactory condition.  Counts were correct at the end of the case.  PLAN OF CARE: Admit to inpatient   PATIENT DISPOSITION:  PACU - hemodynamically stable.   Delay start of Pharmacological VTE agent (>24hrs) due to surgical blood loss or risk of bleeding: yes  

## 2018-02-14 NOTE — Anesthesia Postprocedure Evaluation (Signed)
Anesthesia Post Note  Patient: Driscilla MoatsRonnie D Connett  Procedure(s) Performed: Cervical six-Thoracic one Posterior decompression and fusion (N/A Back)     Patient location during evaluation: PACU Anesthesia Type: General Level of consciousness: awake and alert Pain management: pain level controlled Vital Signs Assessment: post-procedure vital signs reviewed and stable Respiratory status: spontaneous breathing, nonlabored ventilation, respiratory function stable and patient connected to nasal cannula oxygen Cardiovascular status: blood pressure returned to baseline and stable Postop Assessment: no apparent nausea or vomiting Anesthetic complications: no    Last Vitals:  Vitals:   02/14/18 1515 02/14/18 1605  BP: (!) 146/71   Pulse: 87 93  Resp: 18 18  Temp: 36.9 C   SpO2: 97% 96%    Last Pain:  Vitals:   02/14/18 1720  TempSrc:   PainSc: 8                  Joyanna Kleman DAVID

## 2018-02-14 NOTE — Anesthesia Procedure Notes (Signed)
Procedure Name: Intubation Date/Time: 02/14/2018 10:50 AM Performed by: Samara DeistBeckner, Karryn Kosinski B, CRNA Pre-anesthesia Checklist: Patient identified, Emergency Drugs available, Suction available, Patient being monitored and Timeout performed Patient Re-evaluated:Patient Re-evaluated prior to induction Oxygen Delivery Method: Circle system utilized Preoxygenation: Pre-oxygenation with 100% oxygen Induction Type: IV induction Ventilation: Mask ventilation without difficulty Laryngoscope Size: Glidescope and 4 Grade View: Grade I Tube type: Oral Tube size: 7.5 mm Number of attempts: 1 Airway Equipment and Method: Stylet and Video-laryngoscopy Placement Confirmation: ETT inserted through vocal cords under direct vision,  positive ETCO2 and breath sounds checked- equal and bilateral Secured at: 22 cm Tube secured with: Tape Dental Injury: Teeth and Oropharynx as per pre-operative assessment  Difficulty Due To: Difficult Airway-  due to neck instability Comments: Surgeon requests video laryngoscopy, neck maintained neutral for intubation

## 2018-02-14 NOTE — Progress Notes (Signed)
Patient ID: Frutoso SchatzRonnie D Waters, male   DOB: 08/14/1951, 67 y.o.   MRN: [id number] Alert, conversant. Reports significant posterior cervical pain, but improved (decreased) since PACU. No right arm pain as pre-op. Good strength BUE. Vista collar in use.

## 2018-02-15 ENCOUNTER — Encounter (HOSPITAL_COMMUNITY): Payer: Self-pay | Admitting: Neurosurgery

## 2018-02-15 MED ORDER — OXYCODONE-ACETAMINOPHEN 10-325 MG PO TABS: 1.0000 | ORAL_TABLET | Freq: Four times a day (QID) | ORAL | 0 refills | Status: AC | PRN

## 2018-02-15 MED FILL — Thrombin For Soln 5000 Unit: CUTANEOUS | Qty: 2 | Status: AC

## 2018-02-15 MED FILL — Thrombin For Soln 5000 Unit: CUTANEOUS | Qty: 5000 | Status: AC

## 2018-02-15 NOTE — Progress Notes (Addendum)
Subjective: Patient reports "There's still pain, but it's much better"  Objective: Vital signs in last 24 hours: Temp:  [97.2 F (36.2 C)-98.4 F (36.9 C)] 98.1 F (36.7 C) (03/27 0800) Pulse Rate:  [78-101] 78 (03/27 0800) Resp:  [12-21] 18 (03/27 0800) BP: (125-173)/(71-86) 125/77 (03/27 0800) SpO2:  [96 %-100 %] 99 % (03/27 0800)  Intake/Output from previous day: 03/26 0701 - 03/27 0700 In: 2160 [P.O.:360; I.V.:1700; IV Piggyback:100] Out: 200 [Blood:200] Intake/Output this shift: No intake/output data recorded.  Alert, sitting at edge of bed eating breakfast. Wife present. Incisional pain well controlled with Oxy 15 and Robaxin. Good strength BUE. Drsg dry - no erythema or drainage beneath honeycomb. Vista collar in use.   Lab Results: Recent Labs    02/14/18 0834  WBC 8.3  HGB 14.1  HCT 42.2  PLT 220   BMET Recent Labs    02/14/18 0834  NA 136  K 4.1  CL 101  CO2 24  GLUCOSE 112*  BUN 13  CREATININE 1.06  CALCIUM 8.8*    Studies/Results: Dg Cervical Spine 1 View  Result Date: 02/14/2018 CLINICAL DATA:  Cervical fusion. EXAM: DG C-ARM 61-120 MIN; DG CERVICAL SPINE - 1 VIEW COMPARISON:  MRI 01/11/2018. FINDINGS: Pedicle screws noted over the lower cervical spine on single AP view. Endotracheal tube and NG tube noted. 0 minutes 23 seconds fluoroscopy time utilized. IMPRESSION: Postsurgical changes lower cervical spine. Electronically Signed   By: Maisie Fushomas  Register   On: 02/14/2018 12:48   Dg C-arm 1-60 Min  Result Date: 02/14/2018 CLINICAL DATA:  Cervical fusion. EXAM: DG C-ARM 61-120 MIN; DG CERVICAL SPINE - 1 VIEW COMPARISON:  MRI 01/11/2018. FINDINGS: Pedicle screws noted over the lower cervical spine on single AP view. Endotracheal tube and NG tube noted. 0 minutes 23 seconds fluoroscopy time utilized. IMPRESSION: Postsurgical changes lower cervical spine. Electronically Signed   By: Maisie Fushomas  Register   On: 02/14/2018 12:48    Assessment/Plan: Improving  LOS: 1 day  Ok to d/c home per DrStern. Rx for Oxycodone 15mg  for prn home use. Pt has Robaxin 500mg  at home. He verbalizes understanding of d/c instructions and already has f/u appt date.    Patrick Waters, Patrick Waters 02/15/2018, 8:18 AM   Patient is doing well.  Strength in right hand fully recovered.  Doing well, discharge home.

## 2018-02-15 NOTE — Evaluation (Signed)
Physical Therapy Evaluation and Discharge Patient Details Name: Patrick Waters MRN: [id number] DOB: 02/12/1951 Today's Date: 02/15/2018   History of Present Illness  Pt is a 67 y/o male who presents s/p C6-T1 posterior cervical fusion on 02/14/18. PMH significant for prior lumbar surgery.   Clinical Impression  Patient evaluated by Physical Therapy with no further acute PT needs identified. All education has been completed and the patient has no further questions. At the time of PT eval pt was able to perform transfers and ambulation with gross modified independence and no use of AD. Pt was educated on precautions, brace application/wearing schedule, car transfer, and safe activity progression. See below for any follow-up Physical Therapy or equipment needs. PT is signing off. Thank you for this referral.      Follow Up Recommendations No PT follow up;Supervision for mobility/OOB    Equipment Recommendations  None recommended by PT    Recommendations for Other Services       Precautions / Restrictions Precautions Precautions: Fall;Cervical Precaution Booklet Issued: No Precaution Comments: Handout already provided. Reviewed precautions verbally with pt and wife.  Required Braces or Orthoses: Cervical Brace Cervical Brace: Hard collar;At all times Restrictions Weight Bearing Restrictions: No      Mobility  Bed Mobility               General bed mobility comments: Pt was received sitting up in recliner. Pt was educated on log roll technique.   Transfers Overall transfer level: Modified independent Equipment used: None Transfers: Sit to/from Stand           General transfer comment: No assist required. No unsteadiness noted. Pt was cued to minimize UE pushing and to maintain good upright posture.   Ambulation/Gait Ambulation/Gait assistance: Supervision;Modified independent (Device/Increase time) Ambulation Distance (Feet): 250 Feet Assistive device: None Gait  Pattern/deviations: Step-through pattern;Decreased stride length;Trunk flexed Gait velocity: Decreased Gait velocity interpretation: Below normal speed for age/gender General Gait Details: Initially supervision provided for safety however pt progressed to a modified independent level by end of gait training. No AD required.   Stairs Stairs: Yes Stairs assistance: Min guard;Min assist Stair Management: One rail Right;Alternating pattern;Step to pattern;Forwards;No rails Number of Stairs: 10(5; 5) General stair comments: Initially pt attempted 5 stairs with R railing use for support. Then practiced without UE support to simulate home environment. Pt was able to ascend without assist however lost balance descending and minimal assistance required for recovery.   Wheelchair Mobility    Modified Rankin (Stroke Patients Only)       Balance Overall balance assessment: Needs assistance Sitting-balance support: Feet supported;No upper extremity supported Sitting balance-Leahy Scale: Good     Standing balance support: No upper extremity supported;During functional activity Standing balance-Leahy Scale: Fair                               Pertinent Vitals/Pain Pain Assessment: Faces Faces Pain Scale: Hurts even more Pain Location: Incision site Pain Descriptors / Indicators: Operative site guarding Pain Intervention(s): Monitored during session    Home Living Family/patient expects to be discharged to:: Private residence   Available Help at Discharge: Family;Available 24 hours/day Type of Home: House Home Access: Stairs to enter Entrance Stairs-Rails: None Entrance Stairs-Number of Steps: 3 Home Layout: One level        Prior Function Level of Independence: Independent               Hand  Dominance        Extremity/Trunk Assessment   Upper Extremity Assessment Upper Extremity Assessment: Defer to OT evaluation    Lower Extremity Assessment Lower  Extremity Assessment: Overall WFL for tasks assessed    Cervical / Trunk Assessment Cervical / Trunk Assessment: Other exceptions Cervical / Trunk Exceptions: s/p cervical surgery  Communication   Communication: No difficulties  Cognition Arousal/Alertness: Awake/alert Behavior During Therapy: WFL for tasks assessed/performed Overall Cognitive Status: Within Functional Limits for tasks assessed                                        General Comments      Exercises     Assessment/Plan    PT Assessment Patent does not need any further PT services  PT Problem List         PT Treatment Interventions      PT Goals (Current goals can be found in the Care Plan section)  Acute Rehab PT Goals Patient Stated Goal: Home today PT Goal Formulation: All assessment and education complete, DC therapy    Frequency     Barriers to discharge        Co-evaluation               AM-PAC PT "6 Clicks" Daily Activity  Outcome Measure Difficulty turning over in bed (including adjusting bedclothes, sheets and blankets)?: None Difficulty moving from lying on back to sitting on the side of the bed? : A Little Difficulty sitting down on and standing up from a chair with arms (e.g., wheelchair, bedside commode, etc,.)?: A Little Help needed moving to and from a bed to chair (including a wheelchair)?: A Little Help needed walking in hospital room?: A Little Help needed climbing 3-5 steps with a railing? : A Little 6 Click Score: 19    End of Session Equipment Utilized During Treatment: Gait belt;Cervical collar Activity Tolerance: Patient tolerated treatment well Patient left: in chair;with call bell/phone within reach;with family/visitor present Nurse Communication: Mobility status PT Visit Diagnosis: Pain;Other abnormalities of gait and mobility (R26.89) Pain - part of body: (Cervical incision)    Time: 0946-1000 PT Time Calculation (min) (ACUTE ONLY): 14  min   Charges:   PT Evaluation $PT Eval Moderate Complexity: 1 Mod     PT G Codes:        Conni Slipper, PT, DPT Acute Rehabilitation Services Pager: [phone]   Marylynn Pearson 02/15/2018, 10:58 AM

## 2018-02-15 NOTE — Discharge Summary (Signed)
Physician Discharge Summary  Patient ID: Patrick Waters MRN: [id number] DOB/AGE: Jun 17, 1951 67 y.o.  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 Following uncomplicated posterior cervical decompression and fusion, he recovered nicely and transferred to [facility] for nursing care and therapy.  Consults: None  Significant Diagnostic Studies: radiology: X-[place]: intra-op  Treatments: surgery: Cervical six-Thoracic one Posterior decompression and fusion (N/A) with lateral mass screws C 6, C 7 and pedicle screws T 1 with foraminotomy and microdissection    Discharge Exam: Blood pressure 125/77, pulse 78, temperature 98.1 F (36.7 C), temperature source Oral, resp. rate 18, height '5\' 9"'  (1.753 m), weight 108.9 kg (240 lb), SpO2 99 %. Alert, sitting at edge of bed eating breakfast. Wife present. Incisional pain well controlled with Oxy 15 and Robaxin. Good strength BUE. Drsg dry - no erythema or drainage beneath honeycomb. [place] collar in use.    Disposition:  [place] home. Rx for Oxycodone 75m for prn home use. Pt has Robaxin 5067mat home. He verbalizes understanding of d/c instructions and already has f/u appt date.      Allergies as of 02/15/2018      Reactions   Hydrochlorothiazide Hives   Meperidine Nausea Only, Nausea And Vomiting      Medication List     STOP taking these medications   meloxicam 7.5 MG tablet Commonly known as:  MOBIC     TAKE these medications   albuterol 108 (90 Base) MCG/ACT inhaler Commonly known as:  PROVENTIL HFA;VENTOLIN HFA Inhale 2 puffs into the lungs every 6 (six) hours as needed for wheezing or shortness of breath.   aspirin EC 81 MG tablet Take 81 mg by mouth daily.   budesonide-formoterol 160-4.5 MCG/ACT inhaler Commonly known as:  SYMBICORT Inhale 2 puffs into the lungs 2 (two) times daily.   cefdinir 300 MG capsule Commonly known as:  OMNICEF Take 300 mg by mouth 2 (two) times daily.   Co Q 10 100 MG Caps Take 100 mg by mouth daily.   dextromethorphan-guaiFENesin 30-600 MG 12hr tablet Commonly known as:  MUCINEX DM Take 1 tablet by mouth 2 (two) times daily.   diphenhydrAMINE 25 mg capsule Commonly known as:  BENADRYL Take 25 mg by mouth every 6 (six) hours as needed for allergies.   fluticasone 50 MCG/ACT nasal spray Commonly known as:  FLONASE Place 1 spray into both nostrils 2 (two) times daily.   HYQVIA 10 GM/100ML Kit Generic drug:  Immune Globulin-Hyaluronidase 50 g by Subcutaneous Infusion route every 28 (twenty-eight) days.   ipratropium-albuterol 0.5-2.5 (3) MG/3ML Soln Commonly known as:  DUONEB Take 3 mLs by nebulization 3 (three) times daily.   levocetirizine 5 MG tablet Commonly known as:  XYZAL Take 5 mg by mouth every evening.   lisinopril 20 MG tablet Commonly known as:  PRINIVIL,ZESTRIL Take 20 mg by mouth daily.   montelukast 10 MG tablet Commonly known as:  SINGULAIR Take 10 mg by mouth every evening.   MULTIVITAMIN PO Take 1 tablet by  mouth daily.   omeprazole 20 MG capsule Commonly known as:  PRILOSEC Take 20 mg by mouth every evening.   oxyCODONE-acetaminophen 10-325 MG tablet Commonly known as:  PERCOCET Take 1 tablet by mouth every 6 (six) hours as needed for pain. What changed:  Another medication with the same name was added. Make sure you  understand how and when to take each.   oxyCODONE-acetaminophen 10-325 MG tablet Commonly known as:  PERCOCET Take 1 tablet by mouth every 6 (six) hours as needed for pain. What changed:  You were already taking a medication with the same name, and this prescription was added. Make sure you understand how and when to take each.   pravastatin 40 MG tablet Commonly known as:  PRAVACHOL Take 40 mg by mouth every evening.   Vitamin B-12 5000 MCG Tbdp Take 5,000 mcg by mouth daily.   Vitamin D3 5000 units Caps Take 5,000 Units by mouth daily.   XOLAIR 75 MG/0.5ML Sosy Generic drug:  Omalizumab Inject 375 mg into the skin every 14 (fourteen) days.        Signed: Peggyann Shoals, MD 02/15/2018, 8:46 AM

## 2018-02-15 NOTE — Progress Notes (Signed)
Patient alert and oriented, mae's well, voiding adequate amount of urine, swallowing without difficulty, C/o pain prior to discharge and medication already given. Patient discharged home with family. Script and discharged instructions given to patient. Patient and family stated understanding of instructions given. Patient has an appointment with Dr. Venetia MaxonStern

## 2018-02-15 NOTE — Discharge Instructions (Signed)
Wound Care °Leave incision open to air. °You may shower. °Do not scrub directly on incision.  °Do not put any creams, lotions, or ointments on incision. °Activity °Walk each and every day, increasing distance each day. °No lifting greater than 5 lbs.  Avoid excessive neck motion. °No driving for 2 weeks; may ride as a passenger locally. °Wear neck brace at all times except when showering.  If provided soft collar, may wear for comfort unless otherwise instructed. °Diet °Resume your normal diet.  °Return to Work °Will be discussed at you follow up appointment. °Call Your Doctor If Any of These Occur °Redness, drainage, or swelling at the wound.  °Temperature greater than 101 degrees. °Severe pain not relieved by pain medication. °Increased difficulty swallowing. °Incision starts to come apart. °Follow Up Appt °Call today for appointment in 3-4 weeks ([phone]) or for problems.  If you have any hardware placed in your spine, you will need an x-Ciszewski before your appointment. °

## 2018-02-15 NOTE — Evaluation (Signed)
Occupational Therapy Evaluation Patient Details Name: Patrick Waters MRN: [id number] DOB: [birth year] Today's Date: 02/15/2018    History of Present Illness Pt is a 67 y/o male who presents s/p C6-T1 posterior cervical fusion on 02/14/18. PMH significant for prior lumbar surgery.    Clinical Impression   Patient evaluated by Occupational Therapy with no further acute OT needs identified. All education has been completed and the patient has no further questions. See below for any follow-up Occupational Therapy or equipment needs. OT to sign off. Thank you for referral.      Follow Up Recommendations  No OT follow up    Equipment Recommendations  None recommended by OT    Recommendations for Other Services       Precautions / Restrictions Precautions Precautions: Fall;Cervical Precaution Booklet Issued: No Precaution Comments: handout reviwed for detail for adls Required Braces or Orthoses: Cervical Brace Cervical Brace: Hard collar;At all times Restrictions Weight Bearing Restrictions: No      Mobility Bed Mobility Overal bed mobility: Modified Independent                Transfers Overall transfer level: Modified independent                    Balance Overall balance assessment: Needs assistance Sitting-balance support: Feet supported;No upper extremity supported Sitting balance-Leahy Scale: Good     Standing balance support: No upper extremity supported;During functional activity Standing balance-Leahy Scale: Fair                             ADL either performed or assessed with clinical judgement   ADL Overall ADL's : Needs assistance/impaired Eating/Feeding: Modified independent   Grooming: Wash/dry face Grooming Details (indicate cue type and reason): educated on shaving and washing face to avoid bending foward with neck flexion Upper Body Bathing: Set up   Lower Body Bathing: Minimal assistance     Upper Body Dressing Details  (indicate cue type and reason): educated on don doff brace with demo from patient in bathroom at mirror and wife rpesent. educated on changing of the brace pads Lower Body Dressing: Minimal assistance Lower Body Dressing Details (indicate cue type and reason): wife (A) prior to OT arrival dressing for home. OT education on avoid neck flexion for task and limitations the cervical collar will reinforce Toilet Transfer: Supervision/safety       Tub/ Shower Transfer: Supervision/safety(demo at bathroom doorway)   Functional mobility during ADLs: Supervision/safety General ADL Comments: educated on positioning in chair couch and bed . pillows under each UE to help support to minimize pain     Vision Baseline Vision/History: Wears glasses Wears Glasses: At all times       Perception     Praxis      Pertinent Vitals/Pain Pain Assessment: Faces Faces Pain Scale: Hurts little more Pain Location: Incision site Pain Descriptors / Indicators: Operative site guarding Pain Intervention(s): Monitored during session;Premedicated before session;Repositioned     Hand Dominance Right   Extremity/Trunk Assessment Upper Extremity Assessment Upper Extremity Assessment: Overall WFL for tasks assessed(pt declines numbness weakness and not dropping objects)   Lower Extremity Assessment Lower Extremity Assessment: Defer to PT evaluation   Cervical / Trunk Assessment Cervical / Trunk Assessment: Other exceptions Cervical / Trunk Exceptions: s/p cervical surgery   Communication Communication Communication: No difficulties   Cognition Arousal/Alertness: Awake/alert Behavior During Therapy: WFL for tasks assessed/performed Overall Cognitive Status: Within Functional Limits  for tasks assessed                                     General Comments  dressin intact and educated to avoid washing direclty on the cut and always use fresh clean linen each shwoer    Exercises      Shoulder Instructions      Home Living Family/patient expects to be discharged to:: Private residence   Available Help at Discharge: Family;Available 24 hours/day Type of Home: House Home Access: Stairs to enter Entergy CorporationEntrance Stairs-Number of Steps: 3 Entrance Stairs-Rails: None Home Layout: One level     Bathroom Shower/Tub: Producer, television/film/videoWalk-in shower   Bathroom Toilet: Standard         Additional Comments: has x2 dogs in the home. pts wife will have to take on animal care at this time. pt very concerned with jack russel mix being able to sit in the chair next to him Steele Sizer( Dolly and Clare[place]cy)      Prior Functioning/Environment Level of Independence: Independent                 OT Problem List:        OT Treatment/Interventions:      OT Goals(Current goals can be found in the care plan section) Acute Rehab OT Goals Patient Stated Goal: to see dog LUCY OT Goal Formulation: With patient/family  OT Frequency:     Barriers to D/C:            Co-evaluation              AM-PAC PT "6 Clicks" Daily Activity     Outcome Measure Help from another person eating meals?: None Help from another person taking care of personal grooming?: A Little Help from another person toileting, which includes using toliet, bedpan, or urinal?: A Little Help from another person bathing (including washing, rinsing, drying)?: A Little Help from another person to put on and taking off regular upper body clothing?: A Little Help from another person to put on and taking off regular lower body clothing?: A Little 6 Click Score: 19   End of Session Equipment Utilized During Treatment: Cervical collar Nurse Communication: Mobility status;Precautions  Activity Tolerance: Patient tolerated treatment well Patient left: in bed;with call bell/phone within reach;with family/visitor present  OT Visit Diagnosis: Unsteadiness on feet (R26.81)                Time: [id number] OT Time Calculation (min): 19  min Charges:  OT General Charges $OT Visit: 1 Visit OT Evaluation $OT Eval Moderate Complexity: 1 Mod G-Codes:      Mateo FlowJones, Brynn   OTR/L Pager: [phone] Office: [phone] .   Boone MasterJones, Reinhart Saulters B 02/15/2018, 3:25 PM

## 2018-12-03 IMAGING — MR MR CERVICAL SPINE W/O CM
4 of 5 series · 28 of 48 positions shown · non-contrast
Comparison: None.

CLINICAL DATA: Right shoulder and arm pain for the last 2 months.
Weakness and numbness in the right arm.

EXAM:
MRI CERVICAL SPINE WITHOUT CONTRAST
TECHNIQUE: Multiplanar, multisequence MR imaging of the cervical spine was
performed. No intravenous contrast was administered.

[Series 2: T2 · sagittal · 3.0mm · 0.66mm/px · 7 of 15 slices shown (1 of 2)]
[im 1/15]
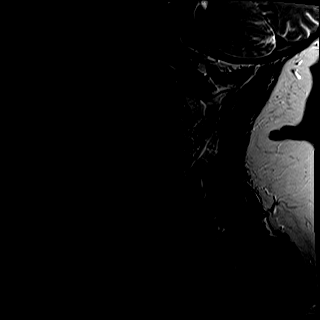
[im 3/15]
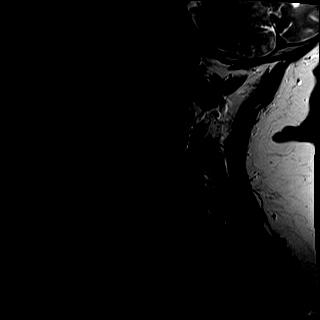
[im 5/15]
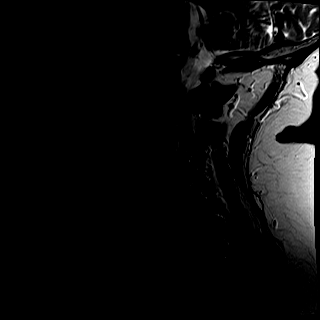
[im 8/15]
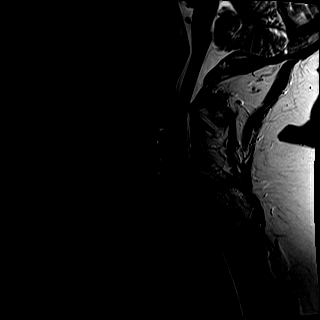
[im 10/15]
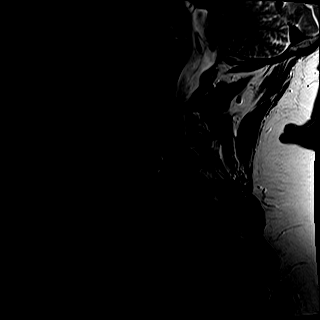
[im 12/15]
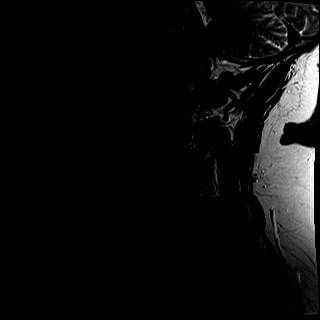
[im 15/15]
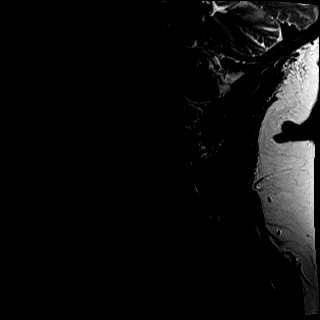

[Series 3: tir sag · sagittal · 3.0mm · 0.41mm/px · 7 of 15 slices shown]
[im 1/15]
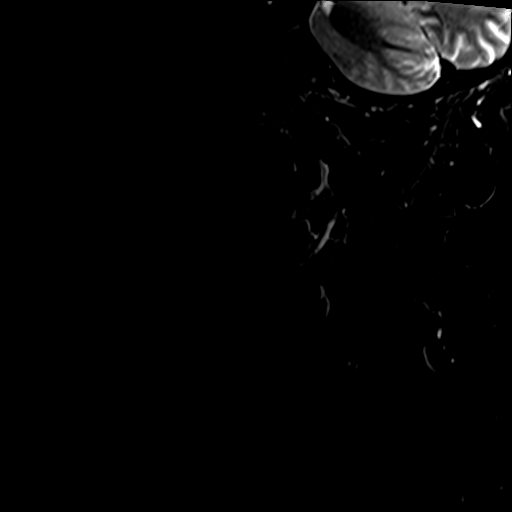
[im 3/15]
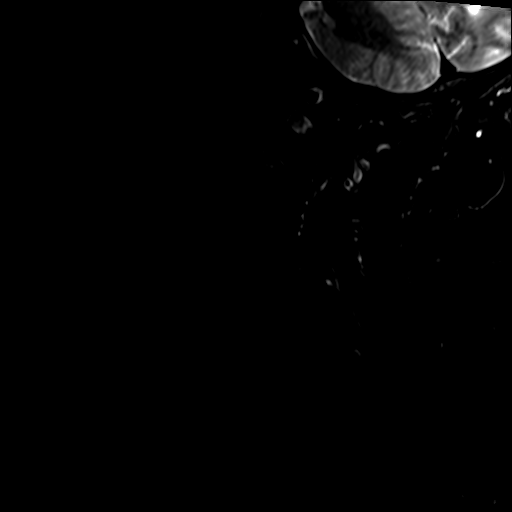
[im 5/15]
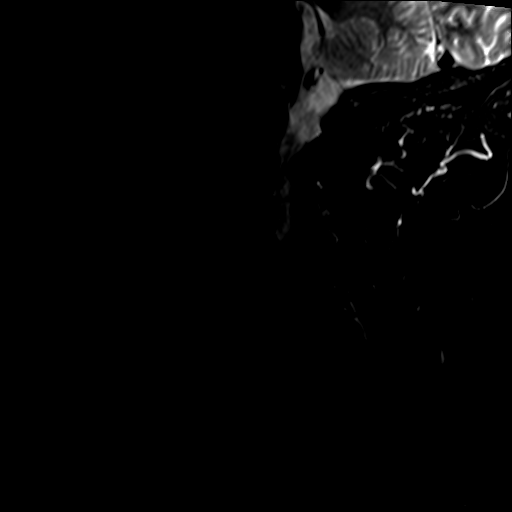
[im 8/15]
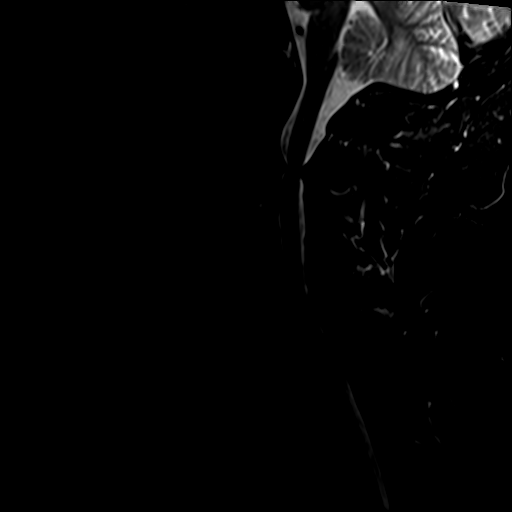
[im 10/15]
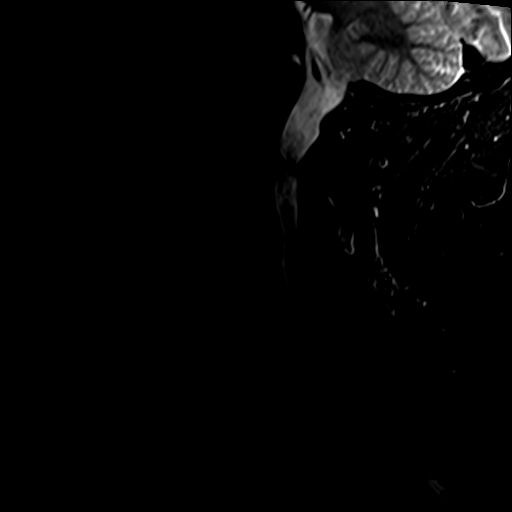
[im 12/15]
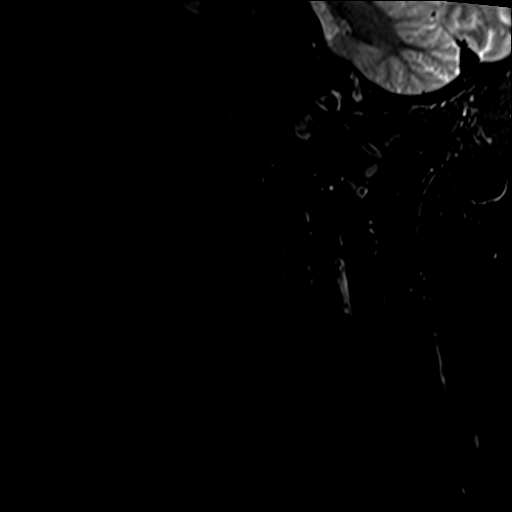
[im 15/15]
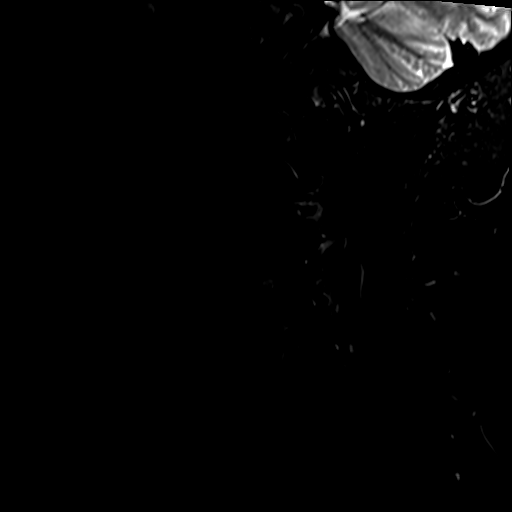

[Series 4: T1 · sagittal · 3.0mm · 0.41mm/px · 6 of 15 slices shown]
[im 1/15]
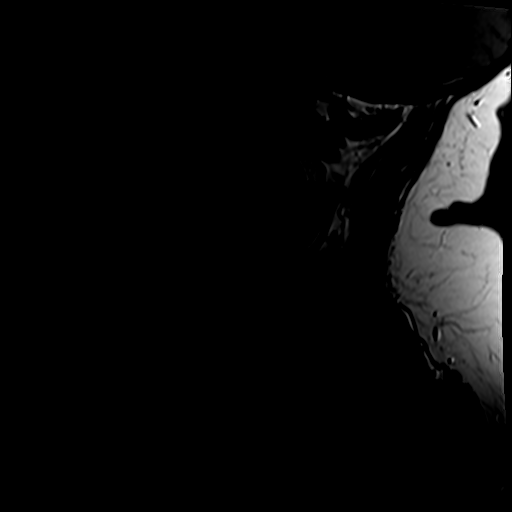
[im 3/15]
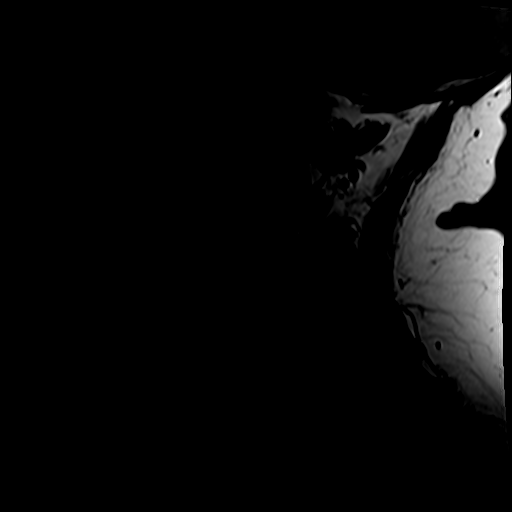
[im 6/15]
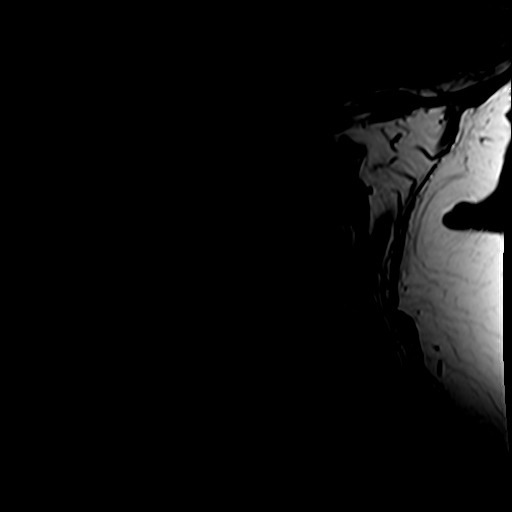
[im 9/15]
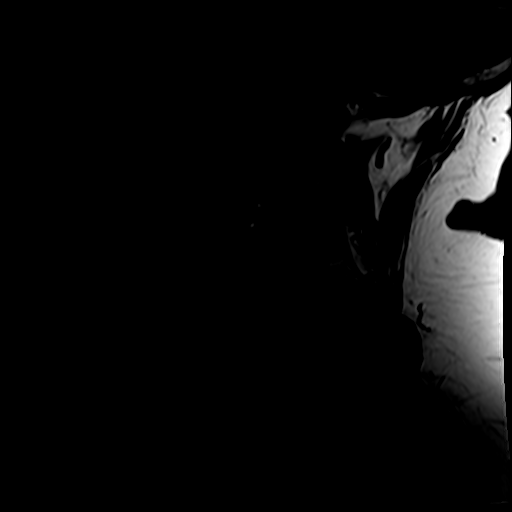
[im 12/15]
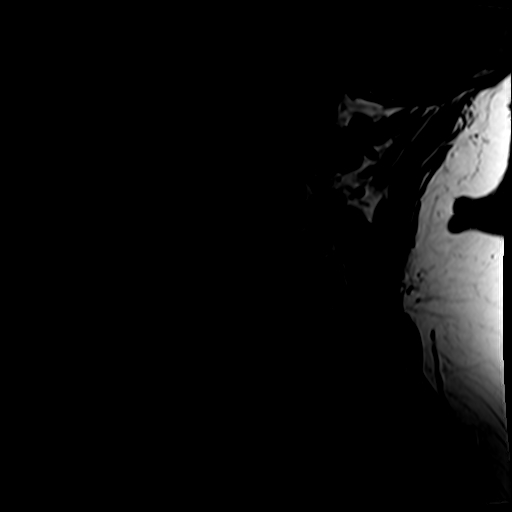
[im 15/15]
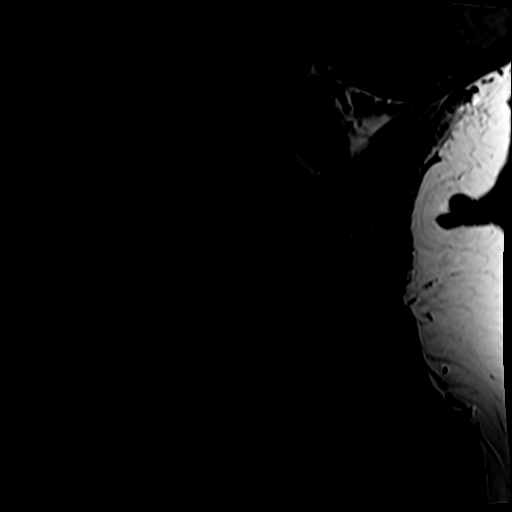

[Series 6: T2 · axial · 3.0mm · 0.70mm/px · z∈[-105,+7]mm · 8 of 33 slices shown (2 of 2)]
[im 1/33]
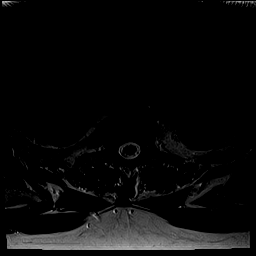
[im 5/33]
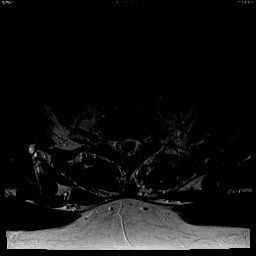
[im 10/33]
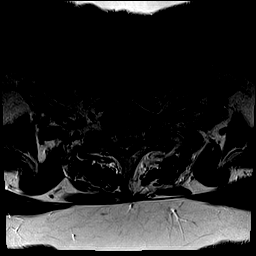
[im 15/33]
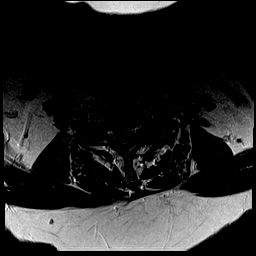
[im 18/33]
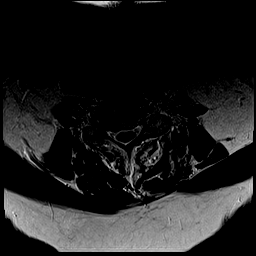
[im 23/33]
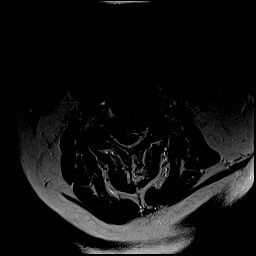
[im 28/33]
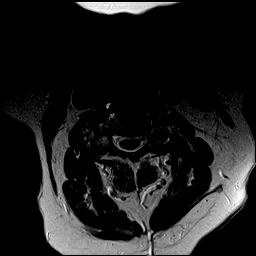
[im 33/33]
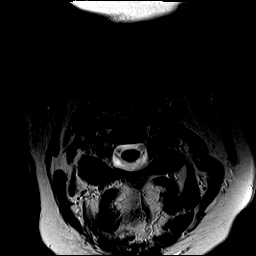

[28 of 48 positions shown; findings below may reference images not displayed]

FINDINGS: Alignment: 2 mm of anterolisthesis at C2-3, C6-7, and C7-T1.

Vertebrae: No fracture, evidence of discitis, or bone lesion. C3-4
non segmentation. C4-5 and C5-6 ACDF with solid arthrodesis. Ventral
plate at C4-5.

Cord: No signal abnormality. Degenerative deformities described
below.

Posterior Fossa, vertebral arteries, paraspinal tissues: There is a
nodule in the left thyroid that is T2 hypointense and T1
hyperintense. The nodule measures up to 4 cm, increased from 7467.

Disc levels:

C2-3: Advanced facet arthropathy with spurring and ligamentum flavum
thickening. Posterior disc osteophyte complex, primarily disc. There
is spinal stenosis with mild cord flattening. Left more than right
foraminal stenosis.

C3-4: Non segmentation. No degenerative changes or impingement. The
right vertebral artery is tortuous at this level and partially
enters the right foramen.

C4-5: ACDF with solid arthrodesis. Residual ridging at the
uncovertebral joints but patent foramina.

C5-6: ACDF with solid arthrodesis.  Patent canal and foramina

C6-7: Advanced facet arthropathy with anterolisthesis. Spurring is
greater on the left. The disc is narrowed with mild bulging.
Moderate spinal stenosis - there is near complete CSF effacement
without cord deformity. Left more than right foraminal impingement.

C7-T1:Facet arthropathy with anterolisthesis. Mild disc narrowing
and bulging. Right more than left foraminal impingement. Mild spinal
stenosis.
IMPRESSION: 1. C3-4 non segmentation and C4-5, C5-6 ACDF with solid arthrodesis.
2. C2-3, C6-7, and C7-T1 advanced facet arthropathy with listheses.
3. C2-3 spinal stenosis with mild cord flattening. No cord signal
abnormality. Left more than right foraminal stenosis.
4. C6-7 moderate spinal stenosis. Left more than right foraminal
impingement.
5. C7-T1 mild spinal stenosis. Right more than left foraminal
impingement.
6. 4 cm left thyroid nodule with significant increased size since
7467. Suspect interval intracystic hemorrhage given the rapid
enlargement and signal characteristics. Recommend thyroid
ultrasound.

## 2019-01-25 ENCOUNTER — Other Ambulatory Visit: Payer: Self-pay | Admitting: Neurosurgery

## 2019-04-26 ENCOUNTER — Encounter (HOSPITAL_COMMUNITY): Payer: Self-pay | Admitting: *Deleted

## 2019-04-26 ENCOUNTER — Other Ambulatory Visit: Payer: Self-pay

## 2019-04-26 NOTE — Progress Notes (Addendum)
Spoke with pt and wife for pre-op call. Pt denies cardiac history or Diabetes. Pt was very upset when he found out his wife could not be with him while he was in the hospital. He stated that someone that he talked to in May here at the hospital told him that his wife could come to his hospital room after his surgery was done and as long as she stayed in the room she could be with him. I explained to him that was not true and she could not be here at all. He and his wife had to think about it and stated that they would call me back. He did call back and has decided to go on and have the surgery. We finished the call.   Have requested Echo and Stress test results from Dr. Amada Jupiter office at [facility] from 2018.  Pt will be tested for Covid 19 upon arrival here due to the fact he lives too far away to come prior to surgery.   Coronavirus Screening  Have you experienced the following symptoms:  Cough NO Fever (>100.25F) NO  Runny nose NO Sore throat NO Difficulty breathing/shortness of breath  NO  Have you or a family member traveled in the last 14 days and where? NO    Patient and wife reminded that hospital visitation restrictions are in effect and the importance of the restrictions.

## 2019-04-27 ENCOUNTER — Inpatient Hospital Stay (HOSPITAL_COMMUNITY): Payer: Medicare Other | Admitting: Certified Registered Nurse Anesthetist

## 2019-04-27 ENCOUNTER — Encounter (HOSPITAL_COMMUNITY): Payer: Self-pay

## 2019-04-27 ENCOUNTER — Inpatient Hospital Stay (HOSPITAL_COMMUNITY)
Admission: RE | Admit: 2019-04-27 | Discharge: 2019-05-01 | DRG: 455 | Disposition: A | Discharge status: Rehabilitation Facility | Payer: Medicare Other | Attending: Neurosurgery | Admitting: Neurosurgery

## 2019-04-27 ENCOUNTER — Other Ambulatory Visit: Payer: Self-pay

## 2019-04-27 ENCOUNTER — Inpatient Hospital Stay (HOSPITAL_COMMUNITY): Payer: Medicare Other

## 2019-04-27 ENCOUNTER — Encounter (HOSPITAL_COMMUNITY)
Admission: RE | Disposition: A | Discharge status: Rehabilitation Facility | Payer: Self-pay | Source: Home / Self Care | Attending: Neurosurgery

## 2019-04-27 DIAGNOSIS — E785 Hyperlipidemia, unspecified: Secondary | ICD-10-CM | POA: Diagnosis present

## 2019-04-27 DIAGNOSIS — I10 Essential (primary) hypertension: Secondary | ICD-10-CM | POA: Diagnosis present

## 2019-04-27 DIAGNOSIS — D849 Immunodeficiency, unspecified: Secondary | ICD-10-CM | POA: Diagnosis present

## 2019-04-27 DIAGNOSIS — M5116 Intervertebral disc disorders with radiculopathy, lumbar region: Principal | ICD-10-CM | POA: Diagnosis present

## 2019-04-27 DIAGNOSIS — K5900 Constipation, unspecified: Secondary | ICD-10-CM | POA: Diagnosis present

## 2019-04-27 DIAGNOSIS — D72823 Leukemoid reaction: Secondary | ICD-10-CM | POA: Diagnosis not present

## 2019-04-27 DIAGNOSIS — M1990 Unspecified osteoarthritis, unspecified site: Secondary | ICD-10-CM | POA: Diagnosis present

## 2019-04-27 DIAGNOSIS — Z419 Encounter for procedure for purposes other than remedying health state, unspecified: Secondary | ICD-10-CM

## 2019-04-27 DIAGNOSIS — Z833 Family history of diabetes mellitus: Secondary | ICD-10-CM

## 2019-04-27 DIAGNOSIS — Z791 Long term (current) use of non-steroidal anti-inflammatories (NSAID): Secondary | ICD-10-CM | POA: Diagnosis not present

## 2019-04-27 DIAGNOSIS — J209 Acute bronchitis, unspecified: Secondary | ICD-10-CM | POA: Diagnosis not present

## 2019-04-27 DIAGNOSIS — I82411 Acute embolism and thrombosis of right femoral vein: Secondary | ICD-10-CM | POA: Diagnosis present

## 2019-04-27 DIAGNOSIS — Z981 Arthrodesis status: Secondary | ICD-10-CM

## 2019-04-27 DIAGNOSIS — Z825 Family history of asthma and other chronic lower respiratory diseases: Secondary | ICD-10-CM | POA: Diagnosis not present

## 2019-04-27 DIAGNOSIS — M4316 Spondylolisthesis, lumbar region: Secondary | ICD-10-CM | POA: Diagnosis present

## 2019-04-27 DIAGNOSIS — M4125 Other idiopathic scoliosis, thoracolumbar region: Secondary | ICD-10-CM | POA: Diagnosis present

## 2019-04-27 DIAGNOSIS — G4730 Sleep apnea, unspecified: Secondary | ICD-10-CM | POA: Diagnosis present

## 2019-04-27 DIAGNOSIS — J449 Chronic obstructive pulmonary disease, unspecified: Secondary | ICD-10-CM | POA: Diagnosis present

## 2019-04-27 DIAGNOSIS — Z7951 Long term (current) use of inhaled steroids: Secondary | ICD-10-CM

## 2019-04-27 DIAGNOSIS — M7989 Other specified soft tissue disorders: Secondary | ICD-10-CM | POA: Diagnosis not present

## 2019-04-27 DIAGNOSIS — Z87891 Personal history of nicotine dependence: Secondary | ICD-10-CM | POA: Diagnosis not present

## 2019-04-27 DIAGNOSIS — K219 Gastro-esophageal reflux disease without esophagitis: Secondary | ICD-10-CM | POA: Diagnosis present

## 2019-04-27 DIAGNOSIS — Z888 Allergy status to other drugs, medicaments and biological substances status: Secondary | ICD-10-CM

## 2019-04-27 DIAGNOSIS — Z1159 Encounter for screening for other viral diseases: Secondary | ICD-10-CM

## 2019-04-27 DIAGNOSIS — M545 Low back pain: Secondary | ICD-10-CM | POA: Diagnosis present

## 2019-04-27 DIAGNOSIS — M48062 Spinal stenosis, lumbar region with neurogenic claudication: Secondary | ICD-10-CM | POA: Diagnosis not present

## 2019-04-27 DIAGNOSIS — J440 Chronic obstructive pulmonary disease with acute lower respiratory infection: Secondary | ICD-10-CM | POA: Diagnosis not present

## 2019-04-27 DIAGNOSIS — K5901 Slow transit constipation: Secondary | ICD-10-CM | POA: Diagnosis not present

## 2019-04-27 DIAGNOSIS — Z4789 Encounter for other orthopedic aftercare: Secondary | ICD-10-CM | POA: Diagnosis present

## 2019-04-27 DIAGNOSIS — M5416 Radiculopathy, lumbar region: Secondary | ICD-10-CM | POA: Diagnosis present

## 2019-04-27 DIAGNOSIS — I270 Primary pulmonary hypertension: Secondary | ICD-10-CM | POA: Diagnosis not present

## 2019-04-27 HISTORY — PX: TRANSFORAMINAL LUMBAR INTERBODY FUSION (TLIF) WITH PEDICLE SCREW FIXATION 3 LEVEL: SHX6143

## 2019-04-27 HISTORY — DX: Sleep apnea, unspecified: G47.30

## 2019-04-27 HISTORY — DX: Chronic obstructive pulmonary disease, unspecified: J44.9

## 2019-04-27 HISTORY — DX: Essential (primary) hypertension: I10

## 2019-04-27 LAB — CBC
HCT: 43 % (ref 39.0–52.0)
Hemoglobin: 14 g/dL (ref 13.0–17.0)
MCH: 30.6 pg (ref 26.0–34.0)
MCHC: 32.6 g/dL (ref 30.0–36.0)
MCV: 93.9 fL (ref 80.0–100.0)
Platelets: 246 10*3/uL (ref 150–400)
RBC: 4.58 MIL/uL (ref 4.22–5.81)
RDW: 12.6 % (ref 11.5–15.5)
WBC: 6.7 10*3/uL (ref 4.0–10.5)
nRBC: 0 % (ref 0.0–0.2)

## 2019-04-27 LAB — BASIC METABOLIC PANEL
Anion gap: 13 (ref 5–15)
BUN: 13 mg/dL (ref 8–23)
CO2: 22 mmol/L (ref 22–32)
Calcium: 9 mg/dL (ref 8.9–10.3)
Chloride: 105 mmol/L (ref 98–111)
Creatinine, Ser: 1.14 mg/dL (ref 0.61–1.24)
GFR calc Af Amer: >60 mL/min (ref >60)
GFR calc non Af Amer: >60 mL/min (ref >60)
Glucose, Bld: 104 mg/dL — ABNORMAL HIGH (ref 70–99)
Potassium: 3.7 mmol/L (ref 3.5–5.1)
Sodium: 140 mmol/L (ref 135–145)

## 2019-04-27 LAB — TYPE AND SCREEN
ABO/RH(D): O POS
Antibody Screen: NEGATIVE

## 2019-04-27 LAB — SARS CORONAVIRUS 2: SARS Coronavirus 2: NOT DETECTED

## 2019-04-27 SURGERY — TRANSFORAMINAL LUMBAR INTERBODY FUSION (TLIF) WITH PEDICLE SCREW FIXATION 3 LEVEL
Anesthesia: General | Laterality: Left

## 2019-04-27 MED ORDER — ROCURONIUM BROMIDE 10 MG/ML (PF) SYRINGE
PREFILLED_SYRINGE | INTRAVENOUS | Status: AC
  Filled 2019-04-27: qty 20

## 2019-04-27 MED ORDER — RISAQUAD PO CAPS
1.0000 | ORAL_CAPSULE | Freq: Every day | ORAL | Status: DC
  Administered 2019-04-28 – 2019-05-01 (×4): 1 via ORAL
  Filled 2019-04-27 (×4): qty 1

## 2019-04-27 MED ORDER — LIDOCAINE-EPINEPHRINE 1 %-1:100000 IJ SOLN
INTRAMUSCULAR | Status: AC
  Filled 2019-04-27: qty 1

## 2019-04-27 MED ORDER — GABAPENTIN 300 MG PO CAPS
300.0000 mg | ORAL_CAPSULE | Freq: Three times a day (TID) | ORAL | Status: DC
  Administered 2019-04-27 – 2019-05-01 (×11): 300 mg via ORAL
  Filled 2019-04-27 (×11): qty 1

## 2019-04-27 MED ORDER — LIDOCAINE-EPINEPHRINE 1 %-1:100000 IJ SOLN
INTRAMUSCULAR | Status: DC | PRN
  Administered 2019-04-27: 10 mL

## 2019-04-27 MED ORDER — LISINOPRIL 20 MG PO TABS
20.0000 mg | ORAL_TABLET | Freq: Every day | ORAL | Status: DC
  Administered 2019-04-28 – 2019-05-01 (×4): 20 mg via ORAL
  Filled 2019-04-27 (×4): qty 1

## 2019-04-27 MED ORDER — OXYCODONE HCL 5 MG PO TABS: 5.0000 mg | ORAL_TABLET | Freq: Once | ORAL | Status: DC | PRN

## 2019-04-27 MED ORDER — ROCURONIUM BROMIDE 50 MG/5ML IV SOSY
PREFILLED_SYRINGE | INTRAVENOUS | Status: DC | PRN
  Administered 2019-04-27: 10 mg via INTRAVENOUS
  Administered 2019-04-27: 50 mg via INTRAVENOUS
  Administered 2019-04-27: 30 mg via INTRAVENOUS
  Administered 2019-04-27: 20 mg via INTRAVENOUS
  Administered 2019-04-27 (×3): 10 mg via INTRAVENOUS

## 2019-04-27 MED ORDER — ROCURONIUM BROMIDE 10 MG/ML (PF) SYRINGE
PREFILLED_SYRINGE | INTRAVENOUS | Status: AC
  Filled 2019-04-27: qty 10

## 2019-04-27 MED ORDER — VITAMIN B-12 1000 MCG PO TABS
5000.0000 ug | ORAL_TABLET | Freq: Every evening | ORAL | Status: DC
  Administered 2019-04-28 – 2019-04-30 (×3): 5000 ug via ORAL
  Filled 2019-04-27 (×3): qty 5

## 2019-04-27 MED ORDER — CO Q 10 100 MG PO CAPS: 100.0000 mg | ORAL_CAPSULE | Freq: Every day | ORAL | Status: DC

## 2019-04-27 MED ORDER — SODIUM CHLORIDE 0.9 % IV SOLN
INTRAVENOUS | Status: DC | PRN
  Administered 2019-04-27: 25 ug/min via INTRAVENOUS

## 2019-04-27 MED ORDER — POLYETHYLENE GLYCOL 3350 17 G PO PACK
17.0000 g | PACK | Freq: Every day | ORAL | Status: DC | PRN
  Administered 2019-04-30 – 2019-05-01 (×2): 17 g via ORAL
  Filled 2019-04-27 (×3): qty 1

## 2019-04-27 MED ORDER — MENTHOL 3 MG MT LOZG: 1.0000 | LOZENGE | OROMUCOSAL | Status: DC | PRN

## 2019-04-27 MED ORDER — MOMETASONE FURO-FORMOTEROL FUM 200-5 MCG/ACT IN AERO
2.0000 | INHALATION_SPRAY | Freq: Two times a day (BID) | RESPIRATORY_TRACT | Status: DC
  Administered 2019-04-28 – 2019-05-01 (×7): 2 via RESPIRATORY_TRACT
  Filled 2019-04-27: qty 8.8

## 2019-04-27 MED ORDER — GLYCOPYRROLATE PF 0.2 MG/ML IJ SOSY
PREFILLED_SYRINGE | INTRAMUSCULAR | Status: AC
  Filled 2019-04-27: qty 1

## 2019-04-27 MED ORDER — CEFAZOLIN SODIUM-DEXTROSE 2-4 GM/100ML-% IV SOLN
2.0000 g | INTRAVENOUS | Status: AC
  Administered 2019-04-27 (×2): 2 g via INTRAVENOUS

## 2019-04-27 MED ORDER — ACETAMINOPHEN 325 MG PO TABS
650.0000 mg | ORAL_TABLET | ORAL | Status: DC | PRN
  Administered 2019-04-30: 650 mg via ORAL
  Filled 2019-04-27: qty 2

## 2019-04-27 MED ORDER — DEXAMETHASONE SODIUM PHOSPHATE 10 MG/ML IJ SOLN
INTRAMUSCULAR | Status: AC
  Filled 2019-04-27: qty 2

## 2019-04-27 MED ORDER — ZOLPIDEM TARTRATE 5 MG PO TABS: 5.0000 mg | ORAL_TABLET | Freq: Every evening | ORAL | Status: DC | PRN

## 2019-04-27 MED ORDER — OXYCODONE-ACETAMINOPHEN 10-325 MG PO TABS: 1.0000 | ORAL_TABLET | ORAL | Status: DC | PRN

## 2019-04-27 MED ORDER — GUAIFENESIN ER 600 MG PO TB12
600.0000 mg | ORAL_TABLET | Freq: Two times a day (BID) | ORAL | Status: DC
  Administered 2019-04-28 – 2019-05-01 (×7): 600 mg via ORAL
  Filled 2019-04-27 (×8): qty 1

## 2019-04-27 MED ORDER — PRAVASTATIN SODIUM 40 MG PO TABS
40.0000 mg | ORAL_TABLET | Freq: Every day | ORAL | Status: DC
  Administered 2019-04-28 – 2019-04-30 (×3): 40 mg via ORAL
  Filled 2019-04-27 (×3): qty 1

## 2019-04-27 MED ORDER — ONDANSETRON HCL 4 MG/2ML IJ SOLN
INTRAMUSCULAR | Status: DC | PRN
  Administered 2019-04-27: 4 mg via INTRAVENOUS

## 2019-04-27 MED ORDER — PHENOL 1.4 % MT LIQD: 1.0000 | OROMUCOSAL | Status: DC | PRN

## 2019-04-27 MED ORDER — FENTANYL CITRATE (PF) 250 MCG/5ML IJ SOLN
INTRAMUSCULAR | Status: AC
  Filled 2019-04-27: qty 5

## 2019-04-27 MED ORDER — IMMUNE GLOBULIN-HYALURONIDASE 10 GM/100ML ~~LOC~~ KIT: 50.0000 g | PACK | SUBCUTANEOUS | Status: DC

## 2019-04-27 MED ORDER — HYDROCODONE-ACETAMINOPHEN 10-325 MG PO TABS: 1.0000 | ORAL_TABLET | ORAL | Status: DC | PRN

## 2019-04-27 MED ORDER — MORPHINE SULFATE (PF) 2 MG/ML IV SOLN: 2.0000 mg | INTRAVENOUS | Status: DC | PRN

## 2019-04-27 MED ORDER — BUPIVACAINE HCL (PF) 0.5 % IJ SOLN
INTRAMUSCULAR | Status: DC | PRN
  Administered 2019-04-27: 10 mL

## 2019-04-27 MED ORDER — DOCUSATE SODIUM 100 MG PO CAPS
100.0000 mg | ORAL_CAPSULE | Freq: Two times a day (BID) | ORAL | Status: DC
  Administered 2019-04-28 – 2019-05-01 (×7): 100 mg via ORAL
  Filled 2019-04-27 (×8): qty 1

## 2019-04-27 MED ORDER — VITAMIN D 25 MCG (1000 UNIT) PO TABS
5000.0000 [IU] | ORAL_TABLET | Freq: Every evening | ORAL | Status: DC
  Administered 2019-04-28 – 2019-04-30 (×3): 5000 [IU] via ORAL
  Filled 2019-04-27 (×3): qty 5

## 2019-04-27 MED ORDER — THROMBIN 5000 UNITS EX SOLR
CUTANEOUS | Status: AC
  Filled 2019-04-27: qty 5000

## 2019-04-27 MED ORDER — ONDANSETRON HCL 4 MG/2ML IJ SOLN
4.0000 mg | Freq: Four times a day (QID) | INTRAMUSCULAR | Status: DC | PRN
  Administered 2019-04-28: 4 mg via INTRAVENOUS
  Filled 2019-04-27: qty 2

## 2019-04-27 MED ORDER — OXYCODONE HCL 5 MG/5ML PO SOLN: 5.0000 mg | Freq: Once | ORAL | Status: DC | PRN

## 2019-04-27 MED ORDER — PROPOFOL 10 MG/ML IV BOLUS
INTRAVENOUS | Status: AC
  Filled 2019-04-27: qty 20

## 2019-04-27 MED ORDER — DEXAMETHASONE SODIUM PHOSPHATE 10 MG/ML IJ SOLN
INTRAMUSCULAR | Status: DC | PRN
  Administered 2019-04-27: 10 mg via INTRAVENOUS

## 2019-04-27 MED ORDER — THROMBIN 20000 UNITS EX SOLR
CUTANEOUS | Status: DC | PRN
  Administered 2019-04-27: 11:00:00 via TOPICAL

## 2019-04-27 MED ORDER — PROMETHAZINE HCL 25 MG/ML IJ SOLN: 6.2500 mg | INTRAMUSCULAR | Status: DC | PRN

## 2019-04-27 MED ORDER — ONDANSETRON HCL 4 MG/2ML IJ SOLN
INTRAMUSCULAR | Status: AC
  Filled 2019-04-27: qty 4

## 2019-04-27 MED ORDER — METHOCARBAMOL 1000 MG/10ML IJ SOLN
500.0000 mg | Freq: Four times a day (QID) | INTRAVENOUS | Status: DC | PRN
  Administered 2019-04-28: 500 mg via INTRAVENOUS
  Filled 2019-04-27 (×2): qty 5

## 2019-04-27 MED ORDER — LACTATED RINGERS IV SOLN
INTRAVENOUS | Status: DC
  Administered 2019-04-27: 08:00:00 via INTRAVENOUS

## 2019-04-27 MED ORDER — LIDOCAINE 2% (20 MG/ML) 5 ML SYRINGE
INTRAMUSCULAR | Status: DC | PRN
  Administered 2019-04-27: 80 mg via INTRAVENOUS

## 2019-04-27 MED ORDER — ONDANSETRON HCL 4 MG PO TABS
4.0000 mg | ORAL_TABLET | Freq: Four times a day (QID) | ORAL | Status: DC | PRN
  Administered 2019-05-01: 12:00:00 4 mg via ORAL
  Filled 2019-04-27: qty 1

## 2019-04-27 MED ORDER — MIDAZOLAM HCL 2 MG/2ML IJ SOLN
INTRAMUSCULAR | Status: AC
  Filled 2019-04-27: qty 2

## 2019-04-27 MED ORDER — SUGAMMADEX SODIUM 500 MG/5ML IV SOLN
INTRAVENOUS | Status: DC | PRN
  Administered 2019-04-27: 300 mg via INTRAVENOUS

## 2019-04-27 MED ORDER — GLYCOPYRROLATE 0.2 MG/ML IJ SOLN
INTRAMUSCULAR | Status: DC | PRN
  Administered 2019-04-27: 0.1 mg via INTRAVENOUS

## 2019-04-27 MED ORDER — SODIUM CHLORIDE 0.9% FLUSH: 3.0000 mL | INTRAVENOUS | Status: DC | PRN

## 2019-04-27 MED ORDER — ADULT MULTIVITAMIN LIQUID CH
Freq: Every day | ORAL | Status: DC
  Administered 2019-04-29: 15 mL via ORAL
  Administered 2019-05-01: 08:00:00 via ORAL
  Filled 2019-04-27 (×4): qty 15

## 2019-04-27 MED ORDER — OXYCODONE HCL 5 MG PO TABS
10.0000 mg | ORAL_TABLET | ORAL | Status: DC | PRN
  Administered 2019-04-27 – 2019-05-01 (×14): 10 mg via ORAL
  Filled 2019-04-27 (×13): qty 2

## 2019-04-27 MED ORDER — ALBUTEROL SULFATE (2.5 MG/3ML) 0.083% IN NEBU: 3.0000 mL | INHALATION_SOLUTION | Freq: Four times a day (QID) | RESPIRATORY_TRACT | Status: DC | PRN

## 2019-04-27 MED ORDER — BUPIVACAINE HCL (PF) 0.5 % IJ SOLN
INTRAMUSCULAR | Status: AC
  Filled 2019-04-27: qty 30

## 2019-04-27 MED ORDER — 0.9 % SODIUM CHLORIDE (POUR BTL) OPTIME
TOPICAL | Status: DC | PRN
  Administered 2019-04-27: 1000 mL

## 2019-04-27 MED ORDER — HYDROMORPHONE HCL 1 MG/ML IJ SOLN
0.2500 mg | INTRAMUSCULAR | Status: DC | PRN
  Administered 2019-04-27 (×2): 0.5 mg via INTRAVENOUS

## 2019-04-27 MED ORDER — ALUM & MAG HYDROXIDE-SIMETH 200-200-20 MG/5ML PO SUSP
30.0000 mL | Freq: Four times a day (QID) | ORAL | Status: DC | PRN
  Administered 2019-04-30 – 2019-05-01 (×2): 30 mL via ORAL
  Filled 2019-04-27 (×2): qty 30

## 2019-04-27 MED ORDER — FLEET ENEMA 7-19 GM/118ML RE ENEM: 1.0000 | ENEMA | Freq: Once | RECTAL | Status: DC | PRN

## 2019-04-27 MED ORDER — OXYCODONE HCL 5 MG PO TABS
ORAL_TABLET | ORAL | Status: AC
  Filled 2019-04-27: qty 2

## 2019-04-27 MED ORDER — FLUTICASONE PROPIONATE 50 MCG/ACT NA SUSP
1.0000 | Freq: Two times a day (BID) | NASAL | Status: DC
  Administered 2019-04-27 – 2019-05-01 (×8): 1 via NASAL
  Filled 2019-04-27: qty 16

## 2019-04-27 MED ORDER — AZELASTINE HCL 0.1 % NA SOLN
2.0000 | Freq: Two times a day (BID) | NASAL | Status: DC
  Administered 2019-04-27 – 2019-05-01 (×8): 2 via NASAL
  Filled 2019-04-27: qty 30

## 2019-04-27 MED ORDER — CHLORHEXIDINE GLUCONATE CLOTH 2 % EX PADS: 6.0000 | MEDICATED_PAD | Freq: Once | CUTANEOUS | Status: DC

## 2019-04-27 MED ORDER — OXYCODONE-ACETAMINOPHEN 5-325 MG PO TABS
1.0000 | ORAL_TABLET | ORAL | Status: DC | PRN
  Administered 2019-04-30: 1 via ORAL
  Filled 2019-04-27: qty 1

## 2019-04-27 MED ORDER — LORATADINE 10 MG PO TABS
10.0000 mg | ORAL_TABLET | Freq: Every evening | ORAL | Status: DC
  Administered 2019-04-28 – 2019-04-30 (×3): 10 mg via ORAL
  Filled 2019-04-27 (×3): qty 1

## 2019-04-27 MED ORDER — SUGAMMADEX SODIUM 500 MG/5ML IV SOLN
INTRAVENOUS | Status: AC
  Filled 2019-04-27: qty 5

## 2019-04-27 MED ORDER — LACTATED RINGERS IV SOLN
INTRAVENOUS | Status: DC | PRN
  Administered 2019-04-27 (×3): via INTRAVENOUS

## 2019-04-27 MED ORDER — METHOCARBAMOL 500 MG PO TABS
500.0000 mg | ORAL_TABLET | Freq: Four times a day (QID) | ORAL | Status: DC | PRN
  Administered 2019-04-27 – 2019-05-01 (×7): 500 mg via ORAL
  Filled 2019-04-27 (×6): qty 1

## 2019-04-27 MED ORDER — LEVOFLOXACIN 500 MG PO TABS: 500.0000 mg | ORAL_TABLET | Freq: Every day | ORAL | Status: DC

## 2019-04-27 MED ORDER — BUPIVACAINE LIPOSOME 1.3 % IJ SUSP
INTRAMUSCULAR | Status: DC | PRN
  Administered 2019-04-27: 20 mL

## 2019-04-27 MED ORDER — THROMBIN 5000 UNITS EX SOLR
OROMUCOSAL | Status: DC | PRN
  Administered 2019-04-27: 11:00:00 via TOPICAL

## 2019-04-27 MED ORDER — ACETAMINOPHEN 650 MG RE SUPP: 650.0000 mg | RECTAL | Status: DC | PRN

## 2019-04-27 MED ORDER — ARTIFICIAL TEARS OPHTHALMIC OINT
TOPICAL_OINTMENT | OPHTHALMIC | Status: DC | PRN
  Administered 2019-04-27: 1 via OPHTHALMIC

## 2019-04-27 MED ORDER — DIPHENHYDRAMINE HCL 25 MG PO CAPS: 25.0000 mg | ORAL_CAPSULE | Freq: Four times a day (QID) | ORAL | Status: DC | PRN

## 2019-04-27 MED ORDER — THROMBIN 20000 UNITS EX SOLR
CUTANEOUS | Status: AC
  Filled 2019-04-27: qty 20000

## 2019-04-27 MED ORDER — HYDROMORPHONE HCL 1 MG/ML IJ SOLN
INTRAMUSCULAR | Status: AC
  Filled 2019-04-27: qty 2

## 2019-04-27 MED ORDER — BISACODYL 10 MG RE SUPP: 10.0000 mg | Freq: Every day | RECTAL | Status: DC | PRN

## 2019-04-27 MED ORDER — EPHEDRINE 5 MG/ML INJ
INTRAVENOUS | Status: AC
  Filled 2019-04-27: qty 20

## 2019-04-27 MED ORDER — CEFAZOLIN SODIUM-DEXTROSE 2-4 GM/100ML-% IV SOLN
2.0000 g | Freq: Three times a day (TID) | INTRAVENOUS | Status: AC
  Administered 2019-04-28 (×2): 2 g via INTRAVENOUS
  Filled 2019-04-27 (×2): qty 100

## 2019-04-27 MED ORDER — SODIUM CHLORIDE 0.9 % IV SOLN: 250.0000 mL | INTRAVENOUS | Status: DC

## 2019-04-27 MED ORDER — IPRATROPIUM-ALBUTEROL 0.5-2.5 (3) MG/3ML IN SOLN
3.0000 mL | Freq: Three times a day (TID) | RESPIRATORY_TRACT | Status: DC
  Administered 2019-04-27 – 2019-04-30 (×10): 3 mL via RESPIRATORY_TRACT
  Filled 2019-04-27 (×10): qty 3

## 2019-04-27 MED ORDER — OXYCODONE HCL 5 MG PO TABS: 5.0000 mg | ORAL_TABLET | ORAL | Status: DC | PRN

## 2019-04-27 MED ORDER — AMLODIPINE BESYLATE 10 MG PO TABS
10.0000 mg | ORAL_TABLET | Freq: Every evening | ORAL | Status: DC
  Administered 2019-04-28 – 2019-04-30 (×3): 10 mg via ORAL
  Filled 2019-04-27 (×3): qty 1

## 2019-04-27 MED ORDER — PROPOFOL 10 MG/ML IV BOLUS
INTRAVENOUS | Status: DC | PRN
  Administered 2019-04-27: 150 mg via INTRAVENOUS
  Administered 2019-04-27: 50 mg via INTRAVENOUS

## 2019-04-27 MED ORDER — SODIUM CHLORIDE 0.9% FLUSH
3.0000 mL | Freq: Two times a day (BID) | INTRAVENOUS | Status: DC
  Administered 2019-04-30 – 2019-05-01 (×2): 3 mL via INTRAVENOUS

## 2019-04-27 MED ORDER — OMALIZUMAB 75 MG/0.5ML ~~LOC~~ SOSY: 375.0000 mg | PREFILLED_SYRINGE | SUBCUTANEOUS | Status: DC

## 2019-04-27 MED ORDER — LIDOCAINE 2% (20 MG/ML) 5 ML SYRINGE
INTRAMUSCULAR | Status: AC
  Filled 2019-04-27: qty 10

## 2019-04-27 MED ORDER — MIDAZOLAM HCL 5 MG/5ML IJ SOLN
INTRAMUSCULAR | Status: DC | PRN
  Administered 2019-04-27: 2 mg via INTRAVENOUS

## 2019-04-27 MED ORDER — PANTOPRAZOLE SODIUM 40 MG PO TBEC
40.0000 mg | DELAYED_RELEASE_TABLET | Freq: Every day | ORAL | Status: DC
  Administered 2019-04-28 – 2019-05-01 (×4): 40 mg via ORAL
  Filled 2019-04-27 (×4): qty 1

## 2019-04-27 MED ORDER — FENTANYL CITRATE (PF) 100 MCG/2ML IJ SOLN
INTRAMUSCULAR | Status: DC | PRN
  Administered 2019-04-27 (×9): 50 ug via INTRAVENOUS

## 2019-04-27 MED ORDER — METHOCARBAMOL 500 MG PO TABS
ORAL_TABLET | ORAL | Status: AC
  Filled 2019-04-27: qty 1

## 2019-04-27 MED ORDER — KCL IN DEXTROSE-NACL 20-5-0.45 MEQ/L-%-% IV SOLN
INTRAVENOUS | Status: DC
  Administered 2019-04-28 – 2019-04-30 (×2): via INTRAVENOUS
  Filled 2019-04-27 (×3): qty 1000

## 2019-04-27 MED ORDER — MONTELUKAST SODIUM 10 MG PO TABS
10.0000 mg | ORAL_TABLET | Freq: Every day | ORAL | Status: DC
  Administered 2019-04-28 – 2019-04-30 (×3): 10 mg via ORAL
  Filled 2019-04-27 (×5): qty 1

## 2019-04-27 SURGICAL SUPPLY — 90 items
ADH SKN CLS APL DERMABOND .7 (GAUZE/BANDAGES/DRESSINGS) ×2
BASKET BONE COLLECTION (BASKET) ×3 IMPLANT
BLADE CLIPPER SURG (BLADE) IMPLANT
BONE CANC CHIPS 20CC PCAN1/4 (Bone Implant) ×3 IMPLANT
BONE CANC CHIPS 40CC CAN1/2 (Bone Implant) ×3 IMPLANT
BUR MATCHSTICK NEURO 3.0 LAGG (BURR) ×3 IMPLANT
BUR PRECISION FLUTE 5.0 (BURR) ×3 IMPLANT
CAGE SPINAL 11X11X31 15D (Cage) ×2 IMPLANT
CANISTER SUCT 3000ML PPV (MISCELLANEOUS) ×3 IMPLANT
CARTRIDGE OIL MAESTRO DRILL (MISCELLANEOUS) ×1 IMPLANT
CHIPS CANC BONE 20CC PCAN1/4 (Bone Implant) ×1 IMPLANT
CHIPS CANC BONE 40CC CAN1/2 (Bone Implant) ×1 IMPLANT
CONT SPEC 4OZ CLIKSEAL STRL BL (MISCELLANEOUS) ×3 IMPLANT
COVER BACK TABLE 60X90IN (DRAPES) ×3 IMPLANT
COVER WAND RF STERILE (DRAPES) ×3 IMPLANT
DECANTER SPIKE VIAL GLASS SM (MISCELLANEOUS) ×3 IMPLANT
DERMABOND ADVANCED (GAUZE/BANDAGES/DRESSINGS) ×4
DERMABOND ADVANCED .7 DNX12 (GAUZE/BANDAGES/DRESSINGS) ×1 IMPLANT
DIFFUSER DRILL AIR PNEUMATIC (MISCELLANEOUS) ×3 IMPLANT
DRAPE C-ARM 42X72 X-RAY (DRAPES) ×3 IMPLANT
DRAPE C-ARMOR (DRAPES) ×3 IMPLANT
DRAPE LAPAROTOMY 100X72X124 (DRAPES) ×3 IMPLANT
DRAPE SURG 17X23 STRL (DRAPES) ×3 IMPLANT
DRSG OPSITE POSTOP 4X10 (GAUZE/BANDAGES/DRESSINGS) ×2 IMPLANT
DURAPREP 26ML APPLICATOR (WOUND CARE) ×3 IMPLANT
ELECT BLADE 4.0 EZ CLEAN MEGAD (MISCELLANEOUS) ×3
ELECT REM PT RETURN 9FT ADLT (ELECTROSURGICAL) ×3
ELECTRODE BLDE 4.0 EZ CLN MEGD (MISCELLANEOUS) IMPLANT
ELECTRODE REM PT RTRN 9FT ADLT (ELECTROSURGICAL) ×1 IMPLANT
EVACUATOR 1/8 PVC DRAIN (DRAIN) ×2 IMPLANT
GAUZE 4X4 16PLY RFD (DISPOSABLE) ×2 IMPLANT
GAUZE SPONGE 4X4 12PLY STRL (GAUZE/BANDAGES/DRESSINGS) ×3 IMPLANT
GLOVE BIO SURGEON STRL SZ8 (GLOVE) ×6 IMPLANT
GLOVE BIOGEL PI IND STRL 6.5 (GLOVE) IMPLANT
GLOVE BIOGEL PI IND STRL 7.0 (GLOVE) IMPLANT
GLOVE BIOGEL PI IND STRL 7.5 (GLOVE) IMPLANT
GLOVE BIOGEL PI IND STRL 8 (GLOVE) ×2 IMPLANT
GLOVE BIOGEL PI IND STRL 8.5 (GLOVE) ×2 IMPLANT
GLOVE BIOGEL PI INDICATOR 6.5 (GLOVE) ×2
GLOVE BIOGEL PI INDICATOR 7.0 (GLOVE) ×2
GLOVE BIOGEL PI INDICATOR 7.5 (GLOVE) ×2
GLOVE BIOGEL PI INDICATOR 8 (GLOVE) ×4
GLOVE BIOGEL PI INDICATOR 8.5 (GLOVE) ×4
GLOVE ECLIPSE 8.0 STRL XLNG CF (GLOVE) ×6 IMPLANT
GLOVE EXAM NITRILE XL STR (GLOVE) IMPLANT
GLOVE SURG SS PI 7.0 STRL IVOR (GLOVE) ×2 IMPLANT
GLOVE SURG SS PI 8.0 STRL IVOR (GLOVE) ×4 IMPLANT
GOWN STRL REUS W/ TWL LRG LVL3 (GOWN DISPOSABLE) IMPLANT
GOWN STRL REUS W/ TWL XL LVL3 (GOWN DISPOSABLE) ×2 IMPLANT
GOWN STRL REUS W/TWL 2XL LVL3 (GOWN DISPOSABLE) ×6 IMPLANT
GOWN STRL REUS W/TWL LRG LVL3 (GOWN DISPOSABLE) ×3
GOWN STRL REUS W/TWL XL LVL3 (GOWN DISPOSABLE) ×12
GRAFT BNE CANC CHIPS 1-8 20CC (Bone Implant) IMPLANT
GRAFT BNE CHIP CANC 1-8 40 (Bone Implant) IMPLANT
HEMOSTAT POWDER KIT SURGIFOAM (HEMOSTASIS) ×3 IMPLANT
IMPL TLX 10X11X31 15DEG (Cage) IMPLANT
IMPL TLX 9X11X31 15DEG (Cage) IMPLANT
KIT BASIN OR (CUSTOM PROCEDURE TRAY) ×3 IMPLANT
KIT INFUSE SMALL (Orthopedic Implant) ×2 IMPLANT
KIT POSITION SURG JACKSON T1 (MISCELLANEOUS) ×3 IMPLANT
KIT TURNOVER KIT B (KITS) ×3 IMPLANT
MILL MEDIUM DISP (BLADE) ×2 IMPLANT
NDL HYPO 25X1 1.5 SAFETY (NEEDLE) ×1 IMPLANT
NDL SPNL 18GX3.5 QUINCKE PK (NEEDLE) IMPLANT
NEEDLE HYPO 25X1 1.5 SAFETY (NEEDLE) ×3 IMPLANT
NEEDLE SPNL 18GX3.5 QUINCKE PK (NEEDLE) ×3 IMPLANT
NS IRRIG 1000ML POUR BTL (IV SOLUTION) ×3 IMPLANT
OIL CARTRIDGE MAESTRO DRILL (MISCELLANEOUS) ×3
PACK LAMINECTOMY NEURO (CUSTOM PROCEDURE TRAY) ×3 IMPLANT
PAD ARMBOARD 7.5X6 YLW CONV (MISCELLANEOUS) ×9 IMPLANT
PATTIES SURGICAL .5 X.5 (GAUZE/BANDAGES/DRESSINGS) IMPLANT
PATTIES SURGICAL .5 X1 (DISPOSABLE) IMPLANT
PATTIES SURGICAL 1X1 (DISPOSABLE) IMPLANT
ROD RELINE-O LORD 5.5X110MM (Rod) ×4 IMPLANT
SCREW LOCK RELINE 5.5 TULIP (Screw) ×16 IMPLANT
SCREW RELINE-O POLY 6.5X50MM (Screw) ×16 IMPLANT
SPONGE LAP 4X18 RFD (DISPOSABLE) IMPLANT
SPONGE SURGIFOAM ABS GEL 100 (HEMOSTASIS) ×2 IMPLANT
STAPLER SKIN PROX WIDE 3.9 (STAPLE) IMPLANT
SUT VIC AB 1 CT1 18XBRD ANBCTR (SUTURE) ×2 IMPLANT
SUT VIC AB 1 CT1 8-18 (SUTURE) ×6
SUT VIC AB 2-0 CT1 18 (SUTURE) ×8 IMPLANT
SUT VIC AB 3-0 SH 8-18 (SUTURE) ×8 IMPLANT
SYR 5ML LL (SYRINGE) IMPLANT
TLX IMPLANT 10X11X31 15DEG (Cage) ×3 IMPLANT
TLX IMPLANT 9X11X31 15DEG (Cage) ×3 IMPLANT
TOWEL GREEN STERILE (TOWEL DISPOSABLE) ×3 IMPLANT
TOWEL GREEN STERILE FF (TOWEL DISPOSABLE) ×3 IMPLANT
TRAY FOLEY MTR SLVR 16FR STAT (SET/KITS/TRAYS/PACK) ×3 IMPLANT
WATER STERILE IRR 1000ML POUR (IV SOLUTION) ×3 IMPLANT

## 2019-04-27 NOTE — Anesthesia Procedure Notes (Signed)
Procedure Name: Intubation Date/Time: 04/27/2019 11:40 AM Performed by: Neldon Newport, CRNA Pre-anesthesia Checklist: Timeout performed, Patient being monitored, Suction available, Emergency Drugs available and Patient identified Patient Re-evaluated:Patient Re-evaluated prior to induction Oxygen Delivery Method: Circle system utilized Preoxygenation: Pre-oxygenation with 100% oxygen Induction Type: IV induction Ventilation: Mask ventilation without difficulty Laryngoscope Size: Mac and 4 Grade View: Grade I Tube type: Oral Tube size: 7.5 mm Number of attempts: 1 Placement Confirmation: breath sounds checked- equal and bilateral,  positive ETCO2 and ETT inserted through vocal cords under direct vision Secured at: 23 cm Tube secured with: Tape Dental Injury: Teeth and Oropharynx as per pre-operative assessment

## 2019-04-27 NOTE — Progress Notes (Signed)
Orthopedic Tech Progress Note Patient Details:  Patrick Waters 1951-03-28 [id number] Patient was pre fitted before surgery.  Patient ID: Patrick Waters, male   DOB: Apr 04, 1951, 68 y.o.   MRN: [id number]   [name] 04/27/2019, 6:35 PM

## 2019-04-27 NOTE — Anesthesia Postprocedure Evaluation (Signed)
Anesthesia Post Note  Patient: Patrick Waters  Procedure(s) Performed: Left Lumbar two-three Lumbar three-four Lumbar four-five Transforaminal lumbar interbody fusion with redo decompression, pedicle screw fixation Lumbar two to Lumbar five (Left )     Patient location during evaluation: PACU Anesthesia Type: General Level of consciousness: sedated Pain management: pain level controlled Vital Signs Assessment: post-procedure vital signs reviewed and stable Respiratory status: spontaneous breathing and respiratory function stable Cardiovascular status: stable Postop Assessment: no apparent nausea or vomiting Anesthetic complications: no    Last Vitals:  Vitals:   04/27/19 1824 04/27/19 2147  BP: (!) 142/62   Pulse: 88   Resp: 16   Temp: 36.6 C   SpO2: 95% 95%    Last Pain:  Vitals:   04/27/19 1913  TempSrc:   PainSc: 6                  Isabelle Matt DANIEL

## 2019-04-27 NOTE — H&P (Signed)
Patient ID:   [id number] Patient: Patrick Arabiaonnie Borden  Date of Birth: 21-Jan-1951 Visit Type: Office Visit   Date: 01/24/2019 03:00 PM Provider: Danae OrleansJoseph D. Venetia MaxonStern MD   This 68 year old male presents for MRI review and Follow Up of Back.  HISTORY OF PRESENT ILLNESS:  1.  MRI review  2.  Follow Up of Back  Patient returns to review his MRI and 2013 lumbar op note. He endorses continued bilateral low back pain, left hip pain, left buttock pain, and constant tingling in his left foot. Pain is not reduced by sitting. He notes that his left buttock and left hip pain is his most severe pain.  L3-4, L4-5 bilateral lumbar decompression performed by Dr. Manson PasseyBrown on 09/23/12 - reviewed operative record.  12/20/18 L-spine MRI without contrast 1. Multilevel lumbar spondylosis which is overall worsened from the September 2013 comparison. Of note there is a left foraminal disc herniation at L4-5 with severe left foraminal stenosis. 2. Increased retrolisthesis and worsening of the degenerative disc disease at L2-3. Moderate central canal stenosis with partial effacement of the lateral recesses. Apparent small disc herniation at the inferior right foramen with moderate right foraminal stenosis. 3. Moderate bilateral foraminal stenosis at L3-4 which appears worsened from comparison. Moderate to severe right foraminal stenosis at L4-5 which appears worsened from comparison. 4. Moderate to severe degenerative disc disease at L3-4 with loss of disc space height and degenerative endplate change. 5. Post-surgical changes at L3-4 and L4-5.         Medical/Surgical/Interim History Reviewed, no change.  Last detailed document date:02/22/2018.     Family History:  Reviewed, no changes.  Last detailed document date:01/11/2018.   Social History: Reviewed, no changes.   MEDICATIONS: (added, continued or stopped this visit) Started Medication Directions Instruction Stopped   amlodipine 10 mg tablet take 1 tablet by  oral route  every day     Aspirin Low Dose 81 mg tablet,delayed release take 1 tablet by oral route  every day     azelastine 137 mcg (0.1 %) nasal spray aerosol spray 2 spray by intranasal route 2 times every day in each nostril     Benadryl 25 mg capsule take 2 capsule by oral route  every 4 - 6 hours as needed     Co Q-10 100 mg capsule      Flonase Allergy Relief 50 mcg/actuation nasal spray,suspension spray 1 - 2 spray by intranasal route  every day in each nostril as needed     hydrocodone 5 mg-acetaminophen 325 mg tablet take 1-2 tablet by oral route  every 6 hours as needed for pain     HyQvia 5 gram/50 mL (10 %) subcutaneous solution      ipratropium-albuterol 0.5 mg-3 mg(2.5 mg base)/3 mL nebulization soln inhale 3 milliliter by nebulization route 4 times every day     levocetirizine 5 mg tablet take 1 tablet by oral route  every day in the evening     lisinopril 20 mg tablet take 1 tablet by oral route  every day     meloxicam 7.5 mg tablet take 1 tablet by oral route  every 2 days     montelukast 10 mg tablet take 1 tablet by oral route  every day in the evening     multivitamin tablet take 1 tablet by oral route  every day     omeprazole 20 mg tablet,delayed release take 1 capsule by oral route  every day    01/17/2019 Percocet  10 mg-325 mg tablet take 1 tablet by oral route  every 6 hours as needed  01/24/2019  01/24/2019 Percocet 10 mg-325 mg tablet take 1 tablet by oral route  every 6 hours as needed     pravastatin 40 mg tablet take 1 tablet by oral route  every day     probiotic      Symbicort 160 mcg-4.5 mcg/actuation HFA aerosol inhaler inhale 2 puff by inhalation route 2 times every day in the morning and evening     Ventolin HFA 90 mcg/actuation aerosol inhaler inhale 2 puff by inhalation route  every 4 - 6 hours as needed     vitamin b12 take 1 tablet by mouth daily     Vitamin D3 5,000 unit tablet      Xolair 150 mg subcutaneous solution inject 1.2 milliliter by  subcutaneous route  every 4 weeks       ALLERGIES: Ingredient Reaction Medication Name Comment  HYDROCHLOROTHIAZIDE     MEPERIDINE HCL  Demerol       PHYSICAL EXAM:   Vitals Date Temp F BP Pulse Ht In Wt Lb BMI BSA Pain Score  01/24/2019  125/77 69 70 242 34.72  5/10     DIAGNOSTIC RESULTS:      IMPRESSION:   L-spine MRI without contrast reveals left far lateral disc herniation at L4-5 with foraminal and extraforaminal nerve compression. worsening retrolisthesis and spondylosis at L2-3. Post-surgical changes at L3-4 and L4-5.  Reviewed 4-view L-spine X-rays - X-rays reveal L2-3 retrolisthesis.  Recommended surgical intervention - advised patient that if only L4-5 fusion was performed his back pain may persist. Also advised patient that there is a risk of adjacent segment disease if only L4-5 fusion is performed. Recommended L2-3, L3-4, and L4-5 TLIF with redo decompression and far lateral microdiscectomy at L4-5. Encouraged weight loss after surgery and discussed with patient that reducing his weight would reduce his risk of developing further issues in the future.  PLAN:  1. Nurse education given 2. Rx LSO brace 3. L2-3, L3-4, and L4-5 TLIF with redo decompression and far lateral microdiscectomy at L4-5 scheduled 4. Follow-up after surgery with X-rays  Orders: Diagnostic Procedures: Assessment Procedure  M54.16 Lumbar Spine- AP/Lat  Instruction(s)/Education: Assessment Instruction  I10 Hypertension education  Z68.34 Lifestyle education regarding diet  Miscellaneous: Assessment   M48.061 LSO Brace   Completed Orders (this encounter) Order Details Reason Side Interpretation Result Initial Treatment Date Region  Hypertension education Patient to follow up with primary care provider        Lifestyle education regarding diet Patient encouraged to eat a well balance diet         Assessment/Plan   # Detail Type Description   1. Assessment Lumbar foraminal stenosis  (M48.061).   Plan Orders LSO Brace.       2. Assessment Disc displacement, lumbar (M51.26).       3. Assessment Lumbar radiculopathy (M54.16).       4. Assessment Essential (primary) hypertension (I10).       5. Assessment Body mass index (BMI) 34.0-34.9, adult (Z68.34).   Plan Orders Today's instructions / counseling include(s) Lifestyle education regarding diet. Clinical information/comments: Patient encouraged to eat a well balance diet.         Pain Management Plan Pain Scale: 5/10. Method: Numeric Pain Intensity Scale. Location: back. Onset: 11/22/2017. Duration: varies. Quality: discomforting. Pain management follow-up plan of care: Patient taking medication as prescribed.  Fall Risk Plan The patient has not fallen  in the last year.     MEDICATIONS PRESCRIBED TODAY    Rx Quantity Refills  PERCOCET 10 mg-325 mg  60 0            Provider:  Danae Orleans. Venetia Maxon MD  01/24/2019 09:54 PM Dictation edited by: Briscoe Burns    CC Providers: PCP  None   Maeola Harman MD  [address] [place], [place] [address]               Electronically signed by Danae Orleans. Venetia Maxon MD on 01/27/2019 01:08 PM  Patient ID:   [id number] Patient: Patrick Waters  Date of Birth: Apr 05, 1951 Visit Type: Office Visit   Date: 01/17/2019 11:00 AM Provider: Danae Orleans. Venetia Maxon MD   This 68 year old male presents for Back pain.  HISTORY OF PRESENT ILLNESS:  1.  Back pain  Patient returns, reporting no cervical issues (status post posterior cervical decompression and fusion 2019); but reports sharp bilateral low back pain, left hip pain, and left leg tingling and numbness into foot increasing over the past 4 months. Patient also notes that his left leg feels "heavy" when standing and occasionally gives way.  Patient stopped his daily walks due to left hip and leg pain.  Norco 5/325 2 tabs t.i.d. X2 months Ordered by primary care physician, offers little pain relief  MRI and  x-Schreffler on [facility]  12/20/18 L-spine MRI without contrast 1. Multilevel lumbar spondylosis which is overall worsened from the September 2013 comparison. Of note there is a left foraminal disc herniation at L4-5 with severe left foraminal stenosis. 2. Increased retrolisthesis and worsening of the degenerative disc disease at L2-3. Moderate central canal stenosis with partial effacement of the lateral recesses. Apparent small disc herniation at the inferior right foramen with moderate right foraminal stenosis. 3.  Moderate bilateral foraminal stenosis at L3-4 which appears worsened from comparison. Moderate to severe right foraminal stenosis at L4-5 which appears worsened from comparison. 4. Moderate to severe degenerative disc disease at L3-4 with loss of disc space height and degenerative endplate change. 5. Post surgical changes at L3-4 and L4-5.         Medical/Surgical/Interim History Reviewed, no change.  Last detailed document date:02/22/2018.     Family History:  Reviewed, no changes.  Last detailed document date:01/11/2018.   Social History: Reviewed, no changes.   MEDICATIONS: (added, continued or stopped this visit) Started Medication Directions Instruction Stopped   amlodipine 10 mg tablet take 1 tablet by oral route  every day     Aspirin Low Dose 81 mg tablet,delayed release take 1 tablet by oral route  every day     azelastine 137 mcg (0.1 %) nasal spray aerosol spray 2 spray by intranasal route 2 times every day in each nostril     Benadryl 25 mg capsule take 2 capsule by oral route  every 4 - 6 hours as needed     Co Q-10 100 mg capsule      Flonase Allergy Relief 50 mcg/actuation nasal spray,suspension spray 1 - 2 spray by intranasal route  every day in each nostril as needed     hydrocodone 5 mg-acetaminophen 325 mg tablet take 1-2 tablet by oral route  every 6 hours as needed for pain     HyQvia 5 gram/50 mL (10 %) subcutaneous solution      ipratropium-albuterol 0.5  mg-3 mg(2.5 mg base)/3 mL nebulization soln inhale 3 milliliter by nebulization route 4 times every day  levocetirizine 5 mg tablet take 1 tablet by oral route  every day in the evening     lisinopril 20 mg tablet take 1 tablet by oral route  every day     meloxicam 7.5 mg tablet take 1 tablet by oral route  every 2 days     montelukast 10 mg tablet take 1 tablet by oral route  every day in the evening     multivitamin tablet take 1 tablet by oral route  every day     omeprazole 20 mg tablet,delayed release take 1 capsule by oral route  every day    01/17/2019 Percocet 10 mg-325 mg tablet take 1 tablet by oral route  every 6 hours as needed     pravastatin 40 mg tablet take 1 tablet by oral route  every day     probiotic      Symbicort 160 mcg-4.5 mcg/actuation HFA aerosol inhaler inhale 2 puff by inhalation route 2 times every day in the morning and evening     Ventolin HFA 90 mcg/actuation aerosol inhaler inhale 2 puff by inhalation route  every 4 - 6 hours as needed     vitamin b12 take 1 tablet by mouth daily     Vitamin D3 5,000 unit tablet      Xolair 150 mg subcutaneous solution inject 1.2 milliliter by subcutaneous route  every 4 weeks       ALLERGIES: Ingredient Reaction Medication Name Comment  HYDROCHLOROTHIAZIDE     MEPERIDINE HCL  Demerol    Reviewed, updated.    PHYSICAL EXAM:   Vitals Date Temp F BP Pulse Ht In Wt Lb BMI BSA Pain Score  01/17/2019  150/75 66 70 239 34.29  8/10      IMPRESSION:   L-spine MRI without contrast reveals left far lateral disc herniation at L4-5 with foraminal and extraforaminal nerve compression. Retrolisthesis noted at L2-3 and L3-4. Post-surgical changes at L3-4 and L4-5.  Patient notes prior surgery at L4-5 was performed by Dr. Manson Passey at [facility]. Requesting operative record from patient's prior surgery and recent MRI report.  Discussed medications - refilled 10/325 Percocet. Advised patient that surgical intervention will  likely be necessary. Patient will return to discuss options on 01/24/19 after operative record is obtained.  PLAN:  1. Refilled 10/325 Percocet 2. Requesting operative record of prior surgery 3. Follow-up on 01/24/19  Orders: Diagnostic Procedures: Assessment Procedure  M54.16 Lumbar Spine- AP/Lat/Flex/Ex   Completed Orders (this encounter) Order Details Reason Side Interpretation Result Initial Treatment Date Region  Lumbar Spine- AP/Lat/Flex/Ex      01/17/2019 All Levels to All Levels   Assessment/Plan   # Detail Type Description   1. Assessment Lumbar radiculopathy (M54.16).            MEDICATIONS PRESCRIBED TODAY    Rx Quantity Refills  PERCOCET 10 mg-325 mg  60 0            Provider:  Danae Orleans. Venetia Maxon MD  01/18/2019 05:01 PM Dictation edited by: Briscoe Burns    CC Providers: PCP  None   Maeola Harman MD  [address] [place], [place] [address]               Electronically signed by Danae Orleans. Venetia Maxon MD on 01/20/2019 12:52 PM

## 2019-04-27 NOTE — Anesthesia Preprocedure Evaluation (Addendum)
Anesthesia Evaluation  Patient identified by MRN, date of birth, ID band Patient awake    Reviewed: Allergy & Precautions, NPO status , Patient's Chart, lab work & pertinent test results  Airway Mallampati: I  TM Distance: >3 FB Neck ROM: Full    Dental no notable dental hx. (+) Teeth Intact, Dental Advidsory Given   Pulmonary asthma , sleep apnea and Continuous Positive Airway Pressure Ventilation , COPD,  COPD inhaler, former smoker,    Pulmonary exam normal breath sounds clear to auscultation       Cardiovascular hypertension, Normal cardiovascular exam Rhythm:Regular Rate:Normal     Neuro/Psych    GI/Hepatic GERD  Medicated and Controlled,  Endo/Other    Renal/GU      Musculoskeletal   Abdominal   Peds  Hematology   Anesthesia Other Findings   Reproductive/Obstetrics                            Anesthesia Physical  Anesthesia Plan  ASA: II  Anesthesia Plan: General   Post-op Pain Management:    Induction: Intravenous  PONV Risk Score and Plan: 2 and Ondansetron and Midazolam  Airway Management Planned: Oral ETT  Additional Equipment:   Intra-op Plan:   Post-operative Plan: Extubation in OR  Informed Consent: I have reviewed the patients History and Physical, chart, labs and discussed the procedure including the risks, benefits and alternatives for the proposed anesthesia with the patient or authorized representative who has indicated his/her understanding and acceptance.     Dental advisory given and Dental Advisory Given  Plan Discussed with: CRNA and Surgeon  Anesthesia Plan Comments:        Anesthesia Quick Evaluation

## 2019-04-27 NOTE — Progress Notes (Signed)
Awake, alert, conversant.  Sore in back.  Strength full in both legs.  Doing well.   

## 2019-04-27 NOTE — Progress Notes (Signed)
PHARMACIST - PHYSICIAN ORDER COMMUNICATION  CONCERNING: P&T Medication Policy on Herbal Medications  DESCRIPTION:  This patient's order for:  Co Q 10  has been noted.  This product(s) is classified as an "herbal" or natural product. Due to a lack of definitive safety studies or FDA approval, nonstandard manufacturing practices, plus the potential risk of unknown drug-drug interactions while on inpatient medications, the Pharmacy and Therapeutics Committee does not permit the use of "herbal" or natural products of this type within [facility].   ACTION TAKEN: The pharmacy department is unable to verify this order at this time. Please reevaluate patient's clinical condition at discharge and address if the herbal or natural product(s) should be resumed at that time.   Dennie Fetters, [place] Pager: [phone] 04/27/2019 8:00 PM

## 2019-04-27 NOTE — Brief Op Note (Signed)
04/27/2019  5:03 PM  PATIENT:  Patrick Waters  68 y.o. male  PRE-OPERATIVE DIAGNOSIS:  Lumbar foraminal stenosis, spondylolisthesis, herniated disc, scoliosis, lumbago, radiculopathy L 23, L 34, L 45 levels  POST-OPERATIVE DIAGNOSIS:   Lumbar foraminal stenosis, spondylolisthesis, herniated disc, scoliosis, lumbago, radiculopathy L 23, L 34, L 45 levels   PROCEDURE:  Procedure(s) with comments: Left Lumbar two-three Lumbar three-four Lumbar four-five Transforaminal lumbar interbody fusion with redo decompression, pedicle screw fixation Lumbar two to Lumbar five (Left) - Left Lumbar two-three Lumbar three-four Lumbar four-five Transforaminal lumbar interbody fusion with redo decompression, pedicle screw fixation Lumbar two to Lumbar five with Expandable TLIF cages, autograft, pedicle screw fixation, posterolateral arthrodesis  SURGEON:  Surgeon(s) and Role:    * Angelisse Riso, MD - Primary    * Jones, David S, MD - Assisting  PHYSICIAN ASSISTANT:   ASSISTANTS: P[place], RN   ANESTHESIA:   general  EBL:  350 mL   BLOOD ADMINISTERED:125 CC PRBC  DRAINS: (Medium) Hemovact drain(s) in the epidural space with  Suction Open   LOCAL MEDICATIONS USED:  MARCAINE    and LIDOCAINE   SPECIMEN:  No Specimen  DISPOSITION OF SPECIMEN:  N/A  COUNTS:  YES  TOURNIQUET:  * No tourniquets in log *   DICTATION: Patient is 68-year-old man with  Recurrent HNP, spondylosis, scoliosis, stenosis, spondylolisthesis, DDD, radiculopathy L 23, L 34, L 45 levels. He has a severe left leg pain and weakness. It was elected to take him to surgery for decompression and fusion at L 23, L 34, L 45 levels.    Procedure: Patient was placed in a prone position on the Jackson table after smooth and uncomplicated induction of general endotracheal anesthesia. His low back was prepped and draped in usual sterile fashion with betadine scrub and DuraPrep. Area of incision was infiltrated with local lidocaine. Incision was made  to the lumbodorsal fascia was incised and exposure was performed of the L2 - L 5 spinous processes laminae facet joint and transverse processes. Intraoperative x-Schweers was obtained which confirmed correct orientation. A total hemi-laminectomy of L2, L 3, L 4 was performed with disarticulation of the facet joint on the left at this level and thorough decompression was performed of both L2 , L 3, L 4, L 5 nerve roots along with the common dural tube. There was densely adherent spondylytic material compressing the thecal sac and both  L2 , L 3, L 4, L 5 nerve roots.  Decompression was greater than would be typically performed for simple interbody fusion. A thorough discectomy and preparation of the endplates was performed at both theL 23, L 34, L 45 levels.  The interspaces were packed with small BMP,NexOss bone graft extender, and expandable TLX Nuvasive TLIF cages. Bone autograft was packed within the interspace with small BMP kit and autograft bone graft. 6 cc of autograft was placed in the L 45 interspace along with an 11 x 11 x 31 mm implant expanded to 15 degrees lordosis. Bone autograft was packed within the interspace with small BMP kit and autograft bone graft. 6 cc of autograft was placed in the L 34 interspace along with an 9 x 11 x 31 mm implant expanded to 15 degrees lordosis. Bone autograft was packed within the interspace with small BMP kit and autograft bone graft. 6 cc of autograft was placed in the L 23 interspace along with an 10 x 11 x 31 mm implant expanded to 15 degrees lordosis.  The posterolateral region   was extensively decorticated and pedicle probes were placed at L 2, L 3,  L 4, L5  bilaterally. Intraoperative fluoroscopy confirmed correct orientationin the AP and lateral plane. 50 x 6.5 mm pedicle screws were placed at each level. Final x-rays demonstrated well-positioned interbody grafts and pedicle screw fixation. 110 mm lordotic rods were placed and locked down in situ and the  posterolateral region was packed with the remaining BMP and autograft on the right with allograft as well (total 60 cc graft material). A medium Hemovac drain was placed and anchored with a stitch.  Long-acting Marcaine was injected in the deep musculature.  Fascia was closed with 1 Vicryl sutures skin edges were reapproximated 2 and 3-0 Vicryl sutures. The wound is dressed with Dermabond and an occlusive dressing. The patient was extubated in the operating room and taken to recovery in stable satisfactory condition having tolerated the operation well. Counts were correct at the end of the case.    PLAN OF CARE: Admit to inpatient   PATIENT DISPOSITION:  PACU - hemodynamically stable.   Delay start of Pharmacological VTE agent (>24hrs) due to surgical blood loss or risk of bleeding: yes  

## 2019-04-27 NOTE — Transfer of Care (Signed)
Immediate Anesthesia Transfer of Care Note  Patient: Patrick Waters  Procedure(s) Performed: Left Lumbar two-three Lumbar three-four Lumbar four-five Transforaminal lumbar interbody fusion with redo decompression, pedicle screw fixation Lumbar two to Lumbar five (Left )  Patient Location: PACU  Anesthesia Type:General  Level of Consciousness: awake  Airway & Oxygen Therapy: Patient Spontanous Breathing and Patient connected to face mask oxygen  Post-op Assessment: Report given to RN and Post -op Vital signs reviewed and stable  Post vital signs: Reviewed and stable  Last Vitals:  Vitals Value Taken Time  BP 112/100 04/27/2019  5:00 PM  Temp    Pulse 80 04/27/2019  5:03 PM  Resp 12 04/27/2019  5:03 PM  SpO2 100 % 04/27/2019  5:03 PM  Vitals shown include unvalidated device data.  Last Pain:  Vitals:   04/27/19 0817  TempSrc:   PainSc: 4          Complications: No apparent anesthesia complications

## 2019-04-27 NOTE — Op Note (Signed)
04/27/2019  5:03 PM  PATIENT:  Patrick Waters  68 y.o. male  PRE-OPERATIVE DIAGNOSIS:  Lumbar foraminal stenosis, spondylolisthesis, herniated disc, scoliosis, lumbago, radiculopathy L 23, L 34, L 45 levels  POST-OPERATIVE DIAGNOSIS:   Lumbar foraminal stenosis, spondylolisthesis, herniated disc, scoliosis, lumbago, radiculopathy L 23, L 34, L 45 levels   PROCEDURE:  Procedure(s) with comments: Left Lumbar two-three Lumbar three-four Lumbar four-five Transforaminal lumbar interbody fusion with redo decompression, pedicle screw fixation Lumbar two to Lumbar five (Left) - Left Lumbar two-three Lumbar three-four Lumbar four-five Transforaminal lumbar interbody fusion with redo decompression, pedicle screw fixation Lumbar two to Lumbar five with Expandable TLIF cages, autograft, pedicle screw fixation, posterolateral arthrodesis  SURGEON:  Surgeon(s) and Role:    Erline Levine, MD - Primary    * Eustace Moore, MD - Assisting  PHYSICIAN ASSISTANT:   ASSISTANTS: [place], RN   ANESTHESIA:   general  EBL:  350 mL   BLOOD ADMINISTERED:125 CC PRBC  DRAINS: (Medium) Hemovact drain(s) in the epidural space with  Suction Open   LOCAL MEDICATIONS USED:  MARCAINE    and LIDOCAINE   SPECIMEN:  No Specimen  DISPOSITION OF SPECIMEN:  N/A  COUNTS:  YES  TOURNIQUET:  * No tourniquets in log *   DICTATION: Patient is 68 year old man with  Recurrent HNP, spondylosis, scoliosis, stenosis, spondylolisthesis, DDD, radiculopathy L 23, L 34, L 45 levels. He has a severe left leg pain and weakness. It was elected to take him to surgery for decompression and fusion at L 23, L 34, L 45 levels.    Procedure: Patient was placed in a prone position on the [place] table after smooth and uncomplicated induction of general endotracheal anesthesia. His low back was prepped and draped in usual sterile fashion with betadine scrub and DuraPrep. Area of incision was infiltrated with local lidocaine. Incision was made  to the lumbodorsal fascia was incised and exposure was performed of the L2 - L 5 spinous processes laminae facet joint and transverse processes. Intraoperative x-Kastelic was obtained which confirmed correct orientation. A total hemi-laminectomy of L2, L 3, L 4 was performed with disarticulation of the facet joint on the left at this level and thorough decompression was performed of both L2 , L 3, L 4, L 5 nerve roots along with the common dural tube. There was densely adherent spondylytic material compressing the thecal sac and both  L2 , L 3, L 4, L 5 nerve roots.  Decompression was greater than would be typically performed for simple interbody fusion. A thorough discectomy and preparation of the endplates was performed at both theL 23, L 34, L 45 levels.  The interspaces were packed with small BMP,NexOss bone graft extender, and expandable TLX Nuvasive TLIF cages. Bone autograft was packed within the interspace with small BMP kit and autograft bone graft. 6 cc of autograft was placed in the L 45 interspace along with an 11 x 11 x 31 mm implant expanded to 15 degrees lordosis. Bone autograft was packed within the interspace with small BMP kit and autograft bone graft. 6 cc of autograft was placed in the L 34 interspace along with an 9 x 11 x 31 mm implant expanded to 15 degrees lordosis. Bone autograft was packed within the interspace with small BMP kit and autograft bone graft. 6 cc of autograft was placed in the L 23 interspace along with an 10 x 11 x 31 mm implant expanded to 15 degrees lordosis.  The posterolateral region  was extensively decorticated and pedicle probes were placed at L 2, L 3,  L 4, L5  bilaterally. Intraoperative fluoroscopy confirmed correct orientationin the AP and lateral plane. 50 x 6.5 mm pedicle screws were placed at each level. Final x-rays demonstrated well-positioned interbody grafts and pedicle screw fixation. 110 mm lordotic rods were placed and locked down in situ and the  posterolateral region was packed with the remaining BMP and autograft on the right with allograft as well (total 60 cc graft material). A medium Hemovac drain was placed and anchored with a stitch.  Long-acting Marcaine was injected in the deep musculature.  Fascia was closed with 1 Vicryl sutures skin edges were reapproximated 2 and 3-0 Vicryl sutures. The wound is dressed with Dermabond and an occlusive dressing. The patient was extubated in the operating room and taken to recovery in stable satisfactory condition having tolerated the operation well. Counts were correct at the end of the case.    PLAN OF CARE: Admit to inpatient   PATIENT DISPOSITION:  PACU - hemodynamically stable.   Delay start of Pharmacological VTE agent (>24hrs) due to surgical blood loss or risk of bleeding: yes

## 2019-04-27 NOTE — Interval H&P Note (Signed)
History and Physical Interval Note:  04/27/2019 7:47 AM  Patrick Waters  has presented today for surgery, with the diagnosis of Lumbar foraminal stenosis.  The various methods of treatment have been discussed with the patient and family. After consideration of risks, benefits and other options for treatment, the patient has consented to  Procedure(s) with comments: Left Lumbar 2-3 Lumbar 3-4 Lumbar 4-5 Transforaminal lumbar interbody fusion with redo decompression, pedicle screw fixation Lumbar 2 to Lumbar 5 (Left) - Left Lumbar 2-3 Lumbar 3-4 Lumbar 4-5 Transforaminal lumbar interbody fusion with redo decompression, pedicle screw fixation Lumbar 2 to Lumbar 5 as a surgical intervention.  The patient's history has been reviewed, patient examined, no change in status, stable for surgery.  I have reviewed the patient's chart and labs.  Questions were answered to the patient's satisfaction.     [name]

## 2019-04-28 MED ORDER — DEXAMETHASONE SODIUM PHOSPHATE 10 MG/ML IJ SOLN
4.0000 mg | Freq: Four times a day (QID) | INTRAMUSCULAR | Status: DC
  Administered 2019-04-28 – 2019-04-30 (×8): 4 mg via INTRAVENOUS
  Filled 2019-04-28 (×8): qty 1

## 2019-04-28 MED ORDER — DEXAMETHASONE SODIUM PHOSPHATE 10 MG/ML IJ SOLN
10.0000 mg | Freq: Once | INTRAMUSCULAR | Status: AC
  Administered 2019-04-28: 10 mg via INTRAVENOUS
  Filled 2019-04-28: qty 1

## 2019-04-28 NOTE — Progress Notes (Signed)
Patient ID: Patrick Waters, male   DOB: 1951/02/21, 68 y.o.   MRN: [id number] I have examined the patient.  I have spoken with Dr. Vertell Limber about the patient.  He has bilateral proximal leg weakness measuring 4- to 4 out of 5.  He does have trouble standing from a seated position without maximum assistance.  Epidural drain has put out 200 cc.  He does not have significant radicular pain.  He does not have increased numbness or tingling.  Could be epidural hematoma.  An MRI in the early postoperative course like this is often difficult to interpret.  Therefore we feel that an appropriate measure would be to start Decadron and watch him closely.  If he has progressive weakness then obviously he needs lumbar reexploration.  All of this has been discussed with the patient.  He has demonstrated understanding.

## 2019-04-28 NOTE — Evaluation (Signed)
Physical Therapy Evaluation Patient Details Name: Patrick Waters MRN: [id number] DOB: 09/01/1951 Today's Date: 04/28/2019   History of Present Illness  Pt is 68 yo male with h/o GERD, COPD, HTN and prior lumbar and cervical surgery who presents for TLIF L3-5.   Clinical Impression  Pt admitted with above diagnosis. Pt currently with functional limitations due to the deficits listed below (see PT Problem List). Pt very weak on eval with B knees buckling in standing and inability to ambulate. Unable to achieve standing in OT eval, with use of stedy, was able to stand with heavy use of UE's. Recommend further rehab before going home with wife.  Pt will benefit from skilled PT to increase their independence and safety with mobility to allow discharge to the venue listed below.       Follow Up Recommendations CIR    Equipment Recommendations  Other (comment)(TBD)    Recommendations for Other Services Rehab consult     Precautions / Restrictions Precautions Precautions: Fall;Back Precaution Booklet Issued: No Precaution Comments: reviewed precautions with pt Required Braces or Orthoses: Spinal Brace Spinal Brace: Lumbar corset Restrictions Weight Bearing Restrictions: No      Mobility  Bed Mobility Overal bed mobility: Needs Assistance Bed Mobility: Rolling;Sidelying to Sit Rolling: Mod assist Sidelying to sit: Mod assist       General bed mobility comments: modA for handheld assist for trunk elevation and pt inititating movements for BLEs to bend and for sitting EOB  Transfers Overall transfer level: Needs assistance Equipment used: Ambulation equipment used Transfers: Sit to/from Stand Sit to Stand: Min assist;+2 physical assistance;+2 safety/equipment;From elevated surface         General transfer comment: B/l knees buckling in standing and sitting  Ambulation/Gait             General Gait Details: unable due to buckling  Stairs            Wheelchair  Mobility    Modified Rankin (Stroke Patients Only)       Balance Overall balance assessment: Needs assistance Sitting-balance support: Bilateral upper extremity supported Sitting balance-Leahy Scale: Fair     Standing balance support: Bilateral upper extremity supported Standing balance-Leahy Scale: Zero Standing balance comment: knees buckling                             Pertinent Vitals/Pain Pain Assessment: Faces Faces Pain Scale: Hurts little more Pain Location: back and legs Pain Descriptors / Indicators: Discomfort;Crushing;Radiating Pain Intervention(s): Limited activity within patient's tolerance;Premedicated before session;Monitored during session;Repositioned    Home Living Family/patient expects to be discharged to:: Private residence Living Arrangements: Spouse/significant other Available Help at Discharge: Family;Available 24 hours/day Type of Home: House Home Access: Stairs to enter Entrance Stairs-Rails: None Entrance Stairs-Number of Steps: 1 Home Layout: One level Home Equipment: Cane - single point      Prior Function Level of Independence: Independent         Comments: retired and independent w/ driving     Hand Dominance   Dominant Hand: Right    Extremity/Trunk Assessment   Upper Extremity Assessment Upper Extremity Assessment: Overall WFL for tasks assessed    Lower Extremity Assessment Lower Extremity Assessment: Generalized weakness;RLE deficits/detail;LLE deficits/detail RLE Deficits / Details: knee ext 3-/5 but buckling in [place] RLE Sensation: WNL RLE Coordination: decreased gross motor LLE Deficits / Details: knee ext 3-/5 but buckling in [place] LLE Sensation: WNL LLE Coordination: decreased gross motor  Cervical / Trunk Assessment Cervical / Trunk Assessment: Normal  Communication   Communication: No difficulties  Cognition Arousal/Alertness: Awake/alert Behavior During Therapy: WFL for tasks  assessed/performed Overall Cognitive Status: Within Functional Limits for tasks assessed                                        General Comments General comments (skin integrity, edema, etc.): discussed need for further therapy before home    Exercises     Assessment/Plan    PT Assessment Patient needs continued PT services  PT Problem List Decreased strength;Decreased activity tolerance;Decreased balance;Decreased mobility;Decreased knowledge of use of DME;Decreased knowledge of precautions;Pain;Obesity       PT Treatment Interventions DME instruction;Gait training;Functional mobility training;Therapeutic activities;Therapeutic exercise;Balance training;Neuromuscular re-education;Patient/family education    PT Goals (Current goals can be found in the Care Plan section)  Acute Rehab PT Goals Patient Stated Goal: to be ready to go home PT Goal Formulation: With patient Time For Goal Achievement: 05/12/19 Potential to Achieve Goals: Fair    Frequency Min 5X/week   Barriers to discharge        Co-evaluation PT/OT/SLP Co-Evaluation/Treatment: Yes Reason for Co-Treatment: Complexity of the patient's impairments (multi-system involvement);For patient/therapist safety PT goals addressed during session: Mobility/safety with mobility;Balance;Proper use of DME OT goals addressed during session: ADL's and self-care       AM-PAC PT "6 Clicks" Mobility  Outcome Measure Help needed turning from your back to your side while in a flat bed without using bedrails?: A Little Help needed moving from lying on your back to sitting on the side of a flat bed without using bedrails?: A Lot Help needed moving to and from a bed to a chair (including a wheelchair)?: A Lot Help needed standing up from a chair using your arms (e.g., wheelchair or bedside chair)?: A Lot Help needed to walk in hospital room?: Total Help needed climbing 3-5 steps with a railing? : Total 6 Click Score:  11    End of Session Equipment Utilized During Treatment: Back brace Activity Tolerance: Patient tolerated treatment well Patient left: in chair;with call bell/phone within reach;with chair alarm set Nurse Communication: Mobility status PT Visit Diagnosis: Unsteadiness on feet (R26.81);Muscle weakness (generalized) (M62.81);Pain;Difficulty in walking, not elsewhere classified (R26.2) Pain - part of body: (back)    Time: 1120-1140 PT Time Calculation (min) (ACUTE ONLY): 20 min   Charges:   PT Evaluation $PT Eval Moderate Complexity: 1 Mod          Lyanne CoVictoria Glendi Mohiuddin, PT  Acute Rehab Services  Pager [phone] Office [phone]   Lawana ChambersVictoria L Janey Petron 04/28/2019, 1:34 PM

## 2019-04-28 NOTE — Progress Notes (Signed)
I came to see Mr. Patrick Waters.  He is sitting up in a chair.  He says he was able to get to the chair himself, although he did require some assistance.  On examination, he has quite good strength in both of his legs, including hip flexors, quadriceps and plantar and dorsiflexion.  He is complaining of persistent numbness in both of his feet.  He does feel that his legs are weak, but I do not believe this is out of proportion to is preoperative status or indicative of a surgical complication.  He has used the urinal and denies any perineal numbness.    I have reassured the patient that he is improving.  I think he will need more time and we will observe his progress and continue a short course of steroids.  I discussed the situation with Dr. Ronnald Ramp.  We will follow the patient and see how he does with physical therapy.

## 2019-04-28 NOTE — Progress Notes (Signed)
Rehab Admissions Coordinator Note:  Patient was screened by Michel Santee for appropriateness for an Inpatient Acute Rehab Consult.  At this time, we are recommending Inpatient Rehab consult.  I will contact MD for order  Michel Santee 04/28/2019, 4:09 PM  I can be reached at [phone].

## 2019-04-28 NOTE — Progress Notes (Addendum)
Occupational Therapy Treatment Patient Details Name: Frutoso SchatzRonnie D Bong MRN: [id number] DOB: 11/24/1950 Today's Date: 04/28/2019    History of present illness Pt is 68 yo male with h/o GERD, COPD, HTN and prior lumbar and cervical surgery who presents for TLIF L3-5.    OT comments  Session 2 today with PT for OOB activity. Pt given steroids for swelling in back surgery site. Pt moving BLEs more and increased confidence and ability for sit to stand minA +2 with stedy x2 times. Pt performing toilet hygiene sitting in stedy with set-upA as gown is long. Pt donning brace with minA, Pt recalling 2/3 precautions with cues. MinA for UB ADL and maxA for LB ADL. Pt tolerating session well and aims to sit in recliner x2 hours. Pt would benefit from continued OT skilled services for ADL, mobility and safety in CIR setting. OT to focus on AE and transfers.   Follow Up Recommendations  CIR;Supervision/Assistance - 24 hour    Equipment Recommendations  3 in 1 bedside commode    Recommendations for Other Services Rehab consult    Precautions / Restrictions Precautions Precautions: Fall;Back Precaution Booklet Issued: Yes (comment) Precaution Comments: Reviewed handout. Required Braces or Orthoses: Spinal Brace Restrictions Weight Bearing Restrictions: No       Mobility Bed Mobility Overal bed mobility: Needs Assistance Bed Mobility: Rolling;Sidelying to Sit Rolling: Mod assist Sidelying to sit: Mod assist       General bed mobility comments: modA for handheld assist for trunk elevation and pt inititating movements for BLEs to bend and for sitting EOB  Transfers Overall transfer level: Needs assistance Equipment used: Ambulation equipment used Transfers: Sit to/from Stand Sit to Stand: Min assist;+2 physical assistance;+2 safety/equipment(w/ stedy)         General transfer comment: B/l knees buckling in standing and sitting    Balance Overall balance assessment: Needs  assistance Sitting-balance support: Bilateral upper extremity supported Sitting balance-Leahy Scale: Fair       Standing balance-Leahy Scale: Zero Standing balance comment: knees buckling                           ADL either performed or assessed with clinical judgement   ADL Overall ADL's : Needs assistance/impaired Eating/Feeding: Set up;Sitting   Grooming: Min guard;Sitting   Upper Body Bathing: Minimal assistance;Sitting   Lower Body Bathing: Maximal assistance;Sitting/lateral leans;Bed level   Upper Body Dressing : Minimal assistance;Sitting Upper Body Dressing Details (indicate cue type and reason): donning brace Lower Body Dressing: Maximal assistance;Sitting/lateral leans Lower Body Dressing Details (indicate cue type and reason): AE education to be provided in next sessions Toilet Transfer: (with stedy)   Toileting- Clothing Manipulation and Hygiene: Maximal assistance;+2 for physical assistance;+2 for safety/equipment;Sitting/lateral lean;Sit to/from stand       Functional mobility during ADLs: Minimal assistance;+2 for physical assistance;+2 for safety/equipment(w/ stedy) General ADL Comments: Pt requiring minA for donning brace and maxA for lB ADL; pt with BLE weakness and inability to perform figure 4 technique.     Vision Baseline Vision/History: Wears glasses Wears Glasses: At all times Vision Assessment?: No apparent visual deficits   Perception     Praxis      Cognition Arousal/Alertness: Awake/alert Behavior During Therapy: WFL for tasks assessed/performed Overall Cognitive Status: Within Functional Limits for tasks assessed  Exercises     Shoulder Instructions       General Comments Pt re-education provided for back precautions.    Pertinent Vitals/ Pain       Pain Assessment: Faces Faces Pain Scale: Hurts little more Pain Location: back and legs Pain Descriptors /  Indicators: Discomfort;Crushing;Radiating Pain Intervention(s): Limited activity within patient's tolerance;Premedicated before session;Monitored during session;Repositioned  Home Living Family/patient expects to be discharged to:: Private residence Living Arrangements: Spouse/significant other Available Help at Discharge: Family;Available 24 hours/day Type of Home: House Home Access: Stairs to enter [employer] of Steps: 1   Home Layout: One level     Bathroom Shower/Tub: Occupational psychologist: Handicapped height     Home Equipment: [place] - single point          Prior Functioning/Environment Level of Independence: Independent        Comments: retired and independent w/ driving   Frequency  Min 3X/week        Progress Toward Goals  OT Goals(current goals can now be found in the care plan section)     Acute Rehab OT Goals Patient Stated Goal: to be ready to go home OT Goal Formulation: With patient Time For Goal Achievement: 05/12/19 Potential to Achieve Goals: Good ADL Goals Pt Will Perform Grooming: with modified independence;standing Pt Will Perform Upper Body Dressing: with set-up;sitting Pt Will Perform Lower Body Dressing: with set-up;with adaptive equipment;sit to/from stand;sitting/lateral leans Pt Will Transfer to Toilet: with min guard assist;bedside commode Pt Will Perform Toileting - Clothing Manipulation and hygiene: with min guard assist;sitting/lateral leans;sit to/from stand Additional ADL Goal #1: Pt will stand x5 mins for ADL with supervision levelA with AE PRN  Plan      Co-evaluation    PT/OT/SLP Co-Evaluation/Treatment: Yes Reason for Co-Treatment: Complexity of the patient's impairments (multi-system involvement);To address functional/ADL transfers;For patient/therapist safety   OT goals addressed during session: ADL's and self-care      AM-PAC OT "6 Clicks" Daily Activity     Outcome Measure   Help from  another person eating meals?: None Help from another person taking care of personal grooming?: A Little Help from another person toileting, which includes using toliet, bedpan, or urinal?: A Lot Help from another person bathing (including washing, rinsing, drying)?: A Lot Help from another person to put on and taking off regular upper body clothing?: A Little Help from another person to put on and taking off regular lower body clothing?: Total 6 Click Score: 15    End of Session Equipment Utilized During Treatment: Gait belt;Back brace;Rolling walker  OT Visit Diagnosis: Unsteadiness on feet (R26.81);Muscle weakness (generalized) (M62.81);Pain Pain - Right/Left: Right Pain - part of body: Leg   Activity Tolerance Patient limited by pain;Treatment limited secondary to medical complications (Comment)   Patient Left in chair;with call bell/phone within reach;with chair alarm set   Nurse Communication Mobility status;Need for lift equipment        Time: 0071-2197 OT Time Calculation (min): 23 min  Charges: OT General Charges $OT Visit: 1 Visit OT Evaluation $OT Eval Moderate Complexity: 1 Mod OT Treatments $Therapeutic Activity: 8-22 mins $Neuromuscular Re-education: 8-22 mins  Darryl Nestle) Marsa Aris OTR/L Acute Rehabilitation Services Pager: [phone] Office: [place] 04/28/2019, 12:21 PM

## 2019-04-28 NOTE — Evaluation (Addendum)
Occupational Therapy Evaluation Patient Details Name: Patrick SchatzRonnie D Waters MRN: [id number] DOB: 03/01/1951 Today's Date: 04/28/2019    History of Present Illness Pt is 68 yo male with h/o GERD, COPD, HTN and prior lumbar and cervical surgery who presents for TLIF L3-5.    Clinical Impression   Pt PTA: living with spouse independent with ADL and mobility- no AD used. Pt currently very limited by pain, B/L knee buckling and inability to mobilize for sit to stand without equipments. Pt performing bed mobility- log roll technique with modA and unable to come to standing at EOB x3 attempts with bed elevated. Pt with increased pain with any movement and BLEs with very poor ROM. Pt would greatly benefit from continued OT skilled services for ADL, mobility and safety. OT to progress as tolerated with AE and self care with transfers acutely.      Follow Up Recommendations  CIR;Supervision/Assistance - 24 hour    Equipment Recommendations  3 in 1 bedside commode    Recommendations for Other Services Rehab consult     Precautions / Restrictions Precautions Precautions: Fall;Back Precaution Booklet Issued: Yes (comment) Precaution Comments: Reviewed handout. Required Braces or Orthoses: Spinal Brace Restrictions Weight Bearing Restrictions: No      Mobility Bed Mobility Overal bed mobility: Needs Assistance Bed Mobility: Rolling;Sidelying to Sit Rolling: Mod assist Sidelying to sit: Mod assist       General bed mobility comments: modA for handheld assist for trunk elevation and pt inititating movements for BLEs to bend and for sitting EOB  Transfers Overall transfer level: Needs assistance Equipment used: Ambulation equipment used Transfers: Sit to/from Stand Sit to Stand: Min assist;+2 physical assistance;+2 safety/equipment(w/ stedy)         General transfer comment: B/l knees buckling in standing and sitting    Balance Overall balance assessment: Needs  assistance Sitting-balance support: Bilateral upper extremity supported Sitting balance-Leahy Scale: Fair       Standing balance-Leahy Scale: Zero Standing balance comment: knees buckling                           ADL either performed or assessed with clinical judgement   ADL Overall ADL's : Needs assistance/impaired Eating/Feeding: Set up;Sitting   Grooming: Min guard;Sitting   Upper Body Bathing: Minimal assistance;Sitting   Lower Body Bathing: Maximal assistance;Sitting/lateral leans;Bed level   Upper Body Dressing : Minimal assistance;Sitting Upper Body Dressing Details (indicate cue type and reason): donning brace Lower Body Dressing: Maximal assistance;Sitting/lateral leans Lower Body Dressing Details (indicate cue type and reason): AE education to be provided in next sessions Toilet Transfer: (with stedy)   Toileting- Clothing Manipulation and Hygiene: Maximal assistance;+2 for physical assistance;+2 for safety/equipment;Sitting/lateral lean;Sit to/from stand       Functional mobility during ADLs: Minimal assistance;+2 for physical assistance;+2 for safety/equipment(w/ stedy) General ADL Comments: Pt requiring minA for donning brace and maxA for lB ADL; pt with BLE weakness and inability to perform figure 4 technique.     Vision Baseline Vision/History: Wears glasses Wears Glasses: At all times Vision Assessment?: No apparent visual deficits     Perception     Praxis      Pertinent Vitals/Pain Pain Assessment: Faces Faces Pain Scale: Hurts little more Pain Location: back and legs Pain Descriptors / Indicators: Discomfort;Crushing;Radiating Pain Intervention(s): Limited activity within patient's tolerance;Premedicated before session;Monitored during session;Repositioned     Hand Dominance Right   Extremity/Trunk Assessment Upper Extremity Assessment Upper Extremity Assessment: Overall WFL for  tasks assessed   Lower Extremity Assessment Lower  Extremity Assessment: Defer to PT evaluation;Generalized weakness RLE Deficits / Details: b/l knees buckling during sit to stand tasks in stedy LLE Deficits / Details: b/l knees buckling during sit to stand tasks in stedy   Cervical / Trunk Assessment Cervical / Trunk Assessment: Normal   Communication Communication Communication: No difficulties   Cognition Arousal/Alertness: Awake/alert Behavior During Therapy: WFL for tasks assessed/performed Overall Cognitive Status: Within Functional Limits for tasks assessed                                     General Comments  Pt re-education provided for back precautions.    Exercises     Shoulder Instructions      Home Living Family/patient expects to be discharged to:: Private residence Living Arrangements: Spouse/significant other Available Help at Discharge: Family;Available 24 hours/day Type of Home: House Home Access: Stairs to enter [employer] of Steps: 1   Home Layout: One level     Bathroom Shower/Tub: Occupational psychologist: Handicapped height     Home Equipment: [employer] - single point          Prior Functioning/Environment Level of Independence: Independent        Comments: retired and independent w/ driving        OT Problem List: Decreased strength;Impaired balance (sitting and/or standing);Decreased activity tolerance;Decreased safety awareness;Pain      OT Treatment/Interventions: Self-care/ADL training;Therapeutic exercise;Neuromuscular education;Energy conservation;DME and/or AE instruction;Therapeutic activities;Patient/family education;Balance training    OT Goals(Current goals can be found in the care plan section) Acute Rehab OT Goals Patient Stated Goal: to be ready to go home OT Goal Formulation: With patient Time For Goal Achievement: 05/12/19 Potential to Achieve Goals: Good ADL Goals Pt Will Perform Grooming: with modified independence;standing Pt  Will Perform Upper Body Dressing: with set-up;sitting Pt Will Perform Lower Body Dressing: with set-up;with adaptive equipment;sit to/from stand;sitting/lateral leans Pt Will Transfer to Toilet: with min guard assist;bedside commode Pt Will Perform Toileting - Clothing Manipulation and hygiene: with min guard assist;sitting/lateral leans;sit to/from stand Additional ADL Goal #1: Pt will stand x5 mins for ADL with supervision levelA with AE PRN  OT Frequency: Min 3X/week   Barriers to D/C:            Co-evaluation              AM-PAC OT "6 Clicks" Daily Activity     Outcome Measure Help from another person eating meals?: None Help from another person taking care of personal grooming?: A Little Help from another person toileting, which includes using toliet, bedpan, or urinal?: A Lot Help from another person bathing (including washing, rinsing, drying)?: A Lot Help from another person to put on and taking off regular upper body clothing?: A Little Help from another person to put on and taking off regular lower body clothing?: Total 6 Click Score: 15   End of Session Equipment Utilized During Treatment: Gait belt;Back brace;Rolling walker Nurse Communication: Mobility status  Activity Tolerance: Patient limited by pain;Treatment limited secondary to medical complications (Comment) Patient left: in bed;with call bell/phone within reach;with bed alarm set(pt sitting EOB with meal- RN aware)  OT Visit Diagnosis: Unsteadiness on feet (R26.81);Muscle weakness (generalized) (M62.81);Pain Pain - part of body: Leg                Time: 5956-3875 OT Time Calculation (min):  30 min Charges:  OT General Charges $OT Visit: 1 Visit OT Evaluation $OT Eval Moderate Complexity: 1 Mod OT Treatments $Therapeutic Activity: 8-22 mins  Revonda StandardAllison Cecil Cranker([place]) [place] OTR/L Acute Rehabilitation Services Pager: [phone] Office: [phone]   Lonzo CloudLLISON J Zackary Mckeone 04/28/2019, 12:13 PM

## 2019-04-29 NOTE — Progress Notes (Signed)
Physical Therapy Treatment Patient Details Name: Patrick SchatzRonnie D Burkitt MRN: [id number] DOB: 12/25/1950 Today's Date: 04/29/2019    History of Present Illness Pt is 68 yo male with h/o GERD, COPD, HTN and prior lumbar and cervical surgery who presents for TLIF L3-5.     PT Comments    Pt able to perform pregait activities today with RW and in stedy including wt shifting and marching in place, still heavily reliant on UE support and mod A+2 and B knee buckling at times. Unable to ambulate safely yet. Used stedy to transfer chair back to bed. PT will continue to follow.    Follow Up Recommendations  CIR     Equipment Recommendations  Other (comment)(TBD)    Recommendations for Other Services Rehab consult     Precautions / Restrictions Precautions Precautions: Fall;Back Precaution Booklet Issued: No Precaution Comments: reviewed precautions with pt Required Braces or Orthoses: Spinal Brace Spinal Brace: Lumbar corset Restrictions Weight Bearing Restrictions: No    Mobility  Bed Mobility Overal bed mobility: Needs Assistance Bed Mobility: Sit to Supine       Sit to supine: Mod assist   General bed mobility comments: mod A for LE's into bed and vc's for maintaining back precautions  Transfers Overall transfer level: Needs assistance Equipment used: Ambulation equipment used;Rolling walker (2 wheeled) Transfers: Sit to/from Stand Sit to Stand: +2 physical assistance;+2 safety/equipment;From elevated surface;Mod assist         General transfer comment: stood to RW with mod A +2 for power up to RW and stedy first time. Next 3 times, pt was able to stand in stedy from flaps with min-guard A (very elevated surface)  Ambulation/Gait Ambulation/Gait assistance: Mod assist;+2 safety/equipment           General Gait Details: pt with knees continuing to buckle so did not ambulate away from chair but marched in place with RW and in stedy with mod A +2. Heavy reliance on UE  support   Stairs             Wheelchair Mobility    Modified Rankin (Stroke Patients Only)       Balance Overall balance assessment: Needs assistance Sitting-balance support: Bilateral upper extremity supported Sitting balance-Leahy Scale: Fair Sitting balance - Comments: able to remove brace with BUE's and maintain balance   Standing balance support: Bilateral upper extremity supported Standing balance-Leahy Scale: Zero Standing balance comment: knees buckling, heavily reliant on UE's and external support                            Cognition Arousal/Alertness: Awake/alert Behavior During Therapy: WFL for tasks assessed/performed Overall Cognitive Status: Within Functional Limits for tasks assessed                                        Exercises General Exercises - Lower Extremity Ankle Circles/Pumps: AROM;Both;15 reps;Supine    General Comments General comments (skin integrity, edema, etc.): bridging in bed for straightening of hips and abdominal activation x5      Pertinent Vitals/Pain Pain Assessment: 0-10 Pain Score: 5  Pain Location: back Pain Descriptors / Indicators: Discomfort;Radiating;Aching Pain Intervention(s): Limited activity within patient's tolerance;Monitored during session    Home Living                      Prior Function  PT Goals (current goals can now be found in the care plan section) Acute Rehab PT Goals Patient Stated Goal: to be ready to go home PT Goal Formulation: With patient Time For Goal Achievement: 05/12/19 Potential to Achieve Goals: Fair Progress towards PT goals: Progressing toward goals    Frequency    Min 5X/week      PT Plan Current plan remains appropriate    Co-evaluation              AM-PAC PT "6 Clicks" Mobility   Outcome Measure  Help needed turning from your back to your side while in a flat bed without using bedrails?: A Little Help needed  moving from lying on your back to sitting on the side of a flat bed without using bedrails?: A Lot Help needed moving to and from a bed to a chair (including a wheelchair)?: A Lot Help needed standing up from a chair using your arms (e.g., wheelchair or bedside chair)?: A Lot Help needed to walk in hospital room?: Total Help needed climbing 3-5 steps with a railing? : Total 6 Click Score: 11    End of Session Equipment Utilized During Treatment: Back brace;Gait belt Activity Tolerance: Patient tolerated treatment well Patient left: with call bell/phone within reach;in bed Nurse Communication: Mobility status PT Visit Diagnosis: Unsteadiness on feet (R26.81);Muscle weakness (generalized) (M62.81);Pain;Difficulty in walking, not elsewhere classified (R26.2) Pain - part of body: (back)     Time: 2683-4196 PT Time Calculation (min) (ACUTE ONLY): 15 min  Charges:  $Gait Training: 8-22 mins                     Leighton Roach, [place]  Pager [phone] Office [place] 04/29/2019, 11:17 AM

## 2019-04-29 NOTE — Progress Notes (Signed)
Patient ID: Patrick Waters, male   DOB: 03/08/1951, 68 y.o.   MRN: [id number] Subjective: Patient reports appropriate back soreness, continued but stable tingling in the feet and legs with weakness limiting his mobility  Objective: Vital signs in last 24 hours: Temp:  [97.8 F (36.6 C)-98.8 F (37.1 C)] 98.8 F (37.1 C) (06/07 0451) Pulse Rate:  [72-89] 72 (06/07 0451) Resp:  [18-20] 18 (06/07 0451) BP: (106-123)/(52-69) 123/59 (06/07 0451) SpO2:  [91 %-97 %] 93 % (06/07 0451)  Intake/Output from previous day: 06/06 0701 - 06/07 0700 In: 630 [P.O.:480; IV Piggyback:150] Out: 2270 [Urine:1975; Drains:295] Intake/Output this shift: No intake/output data recorded.  Neurologic: Grossly normal set for lower extremities that has 4/5 strength each muscle group in bed exam normal sensation  Lab Results: Lab Results  Component Value Date   WBC 6.7 04/27/2019   HGB 14.0 04/27/2019   HCT 43.0 04/27/2019   MCV 93.9 04/27/2019   PLT 246 04/27/2019   No results found for: INR, PROTIME BMET Lab Results  Component Value Date   NA 140 04/27/2019   K 3.7 04/27/2019   CL 105 04/27/2019   CO2 22 04/27/2019   GLUCOSE 104 (H) 04/27/2019   BUN 13 04/27/2019   CREATININE 1.14 04/27/2019   CALCIUM 9.0 04/27/2019    Studies/Results: Dg Lumbar Spine 2-3 Views  Result Date: 04/27/2019 CLINICAL DATA:  L2-L5 TLIF EXAM: DG C-ARM 61-120 MIN; LUMBAR SPINE - 2-3 VIEW COMPARISON:  01/17/2019 Correlation: MRI lumbar spine 12/20/2018 FLUOROSCOPY TIME:  1 minutes 34 seconds Images submitted: 4 FINDINGS: Five lumbar vertebra by prior radiographs, current exam labeled accordingly. BILATERAL pedicle screws placed at L2, L3, L4 and L5. Intervening disc prostheses at the L2-L3, L3-L4 and L4-L5 disc spaces. Bones appear demineralized. No obvious fracture or bone destruction. IMPRESSION: Intraoperative images during L2-L5 fusion. Electronically Signed   By: Lavonia Dana M.D.   On: 04/27/2019 16:36   Dg C-arm 1-60  Min  Result Date: 04/27/2019 CLINICAL DATA:  L2-L5 TLIF EXAM: DG C-ARM 61-120 MIN; LUMBAR SPINE - 2-3 VIEW COMPARISON:  01/17/2019 Correlation: MRI lumbar spine 12/20/2018 FLUOROSCOPY TIME:  1 minutes 34 seconds Images submitted: 4 FINDINGS: Five lumbar vertebra by prior radiographs, current exam labeled accordingly. BILATERAL pedicle screws placed at L2, L3, L4 and L5. Intervening disc prostheses at the L2-L3, L3-L4 and L4-L5 disc spaces. Bones appear demineralized. No obvious fracture or bone destruction. IMPRESSION: Intraoperative images during L2-L5 fusion. Electronically Signed   By: Lavonia Dana M.D.   On: 04/27/2019 16:36    Assessment/Plan: Seems stable, continue therapy, may need CIR  Estimated body mass index is 33 kg/m as calculated from the following:   Height as of this encounter: 5\' 10"  (1.778 m).   Weight as of this encounter: 104.3 kg.    LOS: 2 days    Eustace Moore 04/29/2019, 7:50 AM

## 2019-04-29 NOTE — Progress Notes (Signed)
Inpatient Rehab Admissions Coordinator:   If you would like pt to be considered for CIR, please place order for IP Rehab MD consult.    Shann Medal, PT, DPT Admissions Coordinator [phone] 04/29/19  12:09 PM

## 2019-04-29 NOTE — Progress Notes (Signed)
CSW received consult for SNF placement. CSW notes per PT/OT recommendations patient is recommended CIR. CSW signing off at this time, please re-consult for future social work needs.  Lamonte Richer, LCSW, [place] Worker II [phone]

## 2019-04-30 ENCOUNTER — Encounter (HOSPITAL_COMMUNITY): Payer: Self-pay | Admitting: Neurosurgery

## 2019-04-30 NOTE — Progress Notes (Addendum)
Physical Therapy Treatment Patient Details Name: Patrick SchatzRonnie D Weinrich MRN: [id number] DOB: 07/04/1951 Today's Date: 04/30/2019    History of Present Illness Pt is 68 yo male with h/o GERD, COPD, HTN and prior lumbar and cervical surgery who presents for TLIF L3-5.     PT Comments    Patient progressing slowly towards PT goals. Reports not feeling great this morning due to nausea. Also reports feeling worn out/weak from walking to bathroom with nurse tech earlier and "barely making it." Pt demonstrated marked weakness in BLEs. Bil knee instability noted in standing and buckling evident after taking a few steps to get to chair likely due to fatigue/weakness. Reviewed back precautions as pt only able to recall 1/3. Motivated to get home to wife and dogs. Great CIR candidate. Will continue to follow.   Follow Up Recommendations  CIR     Equipment Recommendations  Other (comment)(defer)    Recommendations for Other Services       Precautions / Restrictions Precautions Precautions: Fall;Back Precaution Booklet Issued: No Precaution Comments: reviewed precautions with pt Required Braces or Orthoses: Spinal Brace Spinal Brace: Lumbar corset Restrictions Weight Bearing Restrictions: No    Mobility  Bed Mobility Overal bed mobility: Needs Assistance Bed Mobility: Rolling;Sidelying to Sit Rolling: Min assist Sidelying to sit: Mod assist;HOB elevated       General bed mobility comments: Cues for log roll technique; use of rail and assist to elevate trunk to get to EOB. Mild dizziness.   Transfers Overall transfer level: Needs assistance Equipment used: Rolling walker (2 wheeled) Transfers: Sit to/from Stand Sit to Stand: Max assist;+2 physical assistance;From elevated surface         General transfer comment: Assist of 2 to stand from EOB with cues for hand placement/technique; Heavy use of UEs, bil knee instability noted. Transferred to chair.  Ambulation/Gait Ambulation/Gait  assistance: Mod assist;+2 safety/equipment Gait Distance (Feet): 4 Feet Assistive device: Rolling walker (2 wheeled) Gait Pattern/deviations: Trunk flexed;Step-to pattern     General Gait Details: Able to take a few steps to get to chair with assist for balance; bil knee instability noted. Buckling started towards end when fatigued. Heavy reliance on UEs.    Stairs             Wheelchair Mobility    Modified Rankin (Stroke Patients Only)       Balance Overall balance assessment: Needs assistance Sitting-balance support: Feet supported;No upper extremity supported Sitting balance-Leahy Scale: Fair Sitting balance - Comments: Assist to donn brace.    Standing balance support: Bilateral upper extremity supported;During functional activity Standing balance-Leahy Scale: Poor Standing balance comment: Reliant on BUEs and external support in standing. Bil knee instability noted. marching in standing ~20 seconds.                             Cognition Arousal/Alertness: Awake/alert Behavior During Therapy: WFL for tasks assessed/performed Overall Cognitive Status: Within Functional Limits for tasks assessed                                        Exercises      General Comments        Pertinent Vitals/Pain Pain Assessment: Faces Faces Pain Scale: Hurts even more Pain Location: back and abdomen Pain Descriptors / Indicators: Discomfort;Aching;Sore Pain Intervention(s): Monitored during session;Repositioned;Limited activity within patient's tolerance  Home Living                      Prior Function            PT Goals (current goals can now be found in the care plan section) Progress towards PT goals: Progressing toward goals    Frequency    Min 5X/week      PT Plan Current plan remains appropriate    Co-evaluation              AM-PAC PT "6 Clicks" Mobility   Outcome Measure  Help needed turning from your  back to your side while in a flat bed without using bedrails?: A Little Help needed moving from lying on your back to sitting on the side of a flat bed without using bedrails?: A Lot Help needed moving to and from a bed to a chair (including a wheelchair)?: A Lot Help needed standing up from a chair using your arms (e.g., wheelchair or bedside chair)?: A Lot Help needed to walk in hospital room?: A Lot Help needed climbing 3-5 steps with a railing? : Total 6 Click Score: 12    End of Session Equipment Utilized During Treatment: Back brace;Gait belt Activity Tolerance: Other (comment)(nausea) Patient left: with call bell/phone within reach;in chair;with chair alarm set Nurse Communication: Mobility status PT Visit Diagnosis: Unsteadiness on feet (R26.81);Muscle weakness (generalized) (M62.81);Pain;Difficulty in walking, not elsewhere classified (R26.2) Pain - part of body: (abdomen;  back)     Time: 1121-1140 PT Time Calculation (min) (ACUTE ONLY): 19 min  Charges:  $Therapeutic Activity: 8-22 mins                     Wray Kearns, PT, DPT Acute Rehabilitation Services Pager [phone] Office [phone]       Marguarite Arbour A Sabra Heck 04/30/2019, 1:19 PM

## 2019-04-30 NOTE — Progress Notes (Addendum)
Subjective: Patient reports "I think I'm doing better. I got to the bathroom this morning with help"  Objective: Vital signs in last 24 hours: Temp:  [97.7 F (36.5 C)-99.3 F (37.4 C)] 99 F (37.2 C) (06/08 0451) Pulse Rate:  [78-90] 78 (06/08 0451) Resp:  [16-18] 18 (06/08 0451) BP: (117-145)/(53-78) 145/69 (06/08 0451) SpO2:  [94 %-98 %] 96 % (06/08 0451)  Intake/Output from previous day: 06/07 0701 - 06/08 0700 In: -  Out: 2945 [Urine:2850; Drains:95] Intake/Output this shift: No intake/output data recorded.  Alert, sitting on commode. Reports no pain at present, only fatigue. Hemovac ~72ml overnight. Improved BLE strength since starting steroid. Hopeful of CIR.  Lab Results: No results for input(s): WBC, HGB, HCT, PLT in the last 72 hours. BMET No results for input(s): NA, K, CL, CO2, GLUCOSE, BUN, CREATININE, CALCIUM in the last 72 hours.  Studies/Results: No results found.  Assessment/Plan: Improving  LOS: 3 days  Per Dr. Vertell Limber, CIR consult, d/c hemovac, continue to mobilize with therapies.   Verdis Prime 04/30/2019, 7:35 AM    Patient is making excellent progress.  He will likely benefit from Rehab.

## 2019-04-30 NOTE — Progress Notes (Addendum)
Inpatient Rehab Admissions:  Inpatient Rehab Consult received.  I met with patient at the bedside for rehabilitation assessment and to discuss goals and expectations of an inpatient rehab admission.  He is open to CIR admission and states I can contact his wife.  I will attempt to reach her today.  Pt states he has been nauseated today with no appreciable bowel movement for several days.  Will look for admission possibly tomorrow pending bed availability and caregiver support at dc.   Spoke with pt's wife.  She is able to provide 247 assist at dc between her and their family.  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  She typically does this at home.    Signed: Shann Medal, PT, DPT Admissions Coordinator [phone] 04/30/19  1:41 PM

## 2019-04-30 NOTE — H&P (Signed)
Physical Medicine and Rehabilitation Admission H&P    Chief complaint: Back pain HPI: Patrick Waters is a 67 year old right-handed male history of primary immunodeficiency receives Hyqvia 50 g every 28 days COPD, prior lumbar and cervical back surgeries.  Per chart review patient lives with spouse.  He is retired independent driving.  One level home with one-step to entry.  Presented 04/27/2019 with persistent low back pain radiating to the lower extremities.  X-rays and imaging revealed lumbar foraminal stenosis, spondylolisthesis, herniated disc, scoliosis with lumbar radiculopathy L2-3, 3-4 and 4-5.  Underwent lumbar 2-3, 3-4 and 4-5 transforaminal lumbar interbody fusion with redo decompression, pedicle screw fixation 04/27/2019 per Dr. Vertell Limber.  Hospital course pain management.  Lumbar corset when out of bed.  Tolerating a regular diet.  Bouts of constipation with bowel program adjusted.  Therapy evaluations completed and patient was admitted for a comprehensive rehab program.  Review of Systems  Constitutional: Negative for chills and fever.  HENT: Negative for hearing loss.   Eyes: Negative for double vision.  Respiratory: Positive for shortness of breath. Negative for cough.   Cardiovascular: Negative for chest pain, palpitations and leg swelling.  Gastrointestinal: Positive for constipation. Negative for heartburn, nausea and vomiting.       GERD  Genitourinary: Negative for dysuria and hematuria.  Musculoskeletal: Positive for back pain and myalgias.  All other systems reviewed and are negative.  Past Medical History:  Diagnosis Date  . Arthritis   . Asthma   . COPD (chronic obstructive pulmonary disease) ([place])   . Dyspnea    with exertion  . Family history of adverse reaction to anesthesia    Daughter- N/V  . GERD (gastroesophageal reflux disease)   . Hypertension   . Pneumonia   . Sleep apnea    uses cpap   Past Surgical History:  Procedure Laterality Date  . ANAL  FISSURE REPAIR    . BACK SURGERY     lower back  . cervial fusion     x 2  . COLONOSCOPY    . Hemmorhoid    . POSTERIOR CERVICAL FUSION/FORAMINOTOMY N/A 02/14/2018   Procedure: Cervical six-Thoracic one Posterior decompression and fusion;  Surgeon: Erline Levine, MD;  Location: [place];  Service: Neurosurgery;  Laterality: N/A;  . TRANSFORAMINAL LUMBAR INTERBODY FUSION (TLIF) WITH PEDICLE SCREW FIXATION 3 LEVEL Left 04/27/2019   Procedure: Left Lumbar two-three Lumbar three-four Lumbar four-five Transforaminal lumbar interbody fusion with redo decompression, pedicle screw fixation Lumbar two to Lumbar five;  Surgeon: Erline Levine, MD;  Location: [place];  Service: Neurosurgery;  Laterality: Left;  Left Lumbar two-three Lumbar three-four Lumbar four-five Transforaminal lumbar interbody fusion with redo decompression, pedicle   Family History  Problem Relation Age of Onset  . COPD Father   . Diabetes Brother    Social History:  reports that he quit smoking about 33 years ago. He quit after 20.00 years of use. He has quit using smokeless tobacco. He reports previous alcohol use. He reports previous drug use. Allergies:  Allergies  Allergen Reactions  . Hydrochlorothiazide Hives and Rash  . Meperidine Nausea And Vomiting   Medications Prior to Admission  Medication Sig Dispense Refill  . albuterol (PROVENTIL HFA;VENTOLIN HFA) 108 (90 Base) MCG/ACT inhaler Inhale 2 puffs into the lungs every 6 (six) hours as needed for wheezing or shortness of breath.    Marland Kitchen amLODipine (NORVASC) 10 MG tablet Take 10 mg by mouth daily.     Marland Kitchen azelastine (ASTELIN) 0.1 %  nasal spray Place 2 sprays into both nostrils 2 (two) times daily.     . budesonide-formoterol (SYMBICORT) 160-4.5 MCG/ACT inhaler Inhale 2 puffs into the lungs 2 (two) times daily.    . Calcium Carb-Cholecalciferol (CALCIUM + D3 PO) Take 1 tablet by mouth daily with breakfast.    . Cholecalciferol (VITAMIN D3) 5000 units CAPS Take 5,000 Units by mouth  every evening.     . Coenzyme Q10 (CO Q 10) 100 MG CAPS Take 100 mg by mouth daily.    . Cyanocobalamin (VITAMIN B-12) 5000 MCG TBDP Take 5,000 mcg by mouth every evening.     . fluticasone (FLONASE) 50 MCG/ACT nasal spray Place 1 spray into both nostrils 2 (two) times daily.    Marland Kitchen guaiFENesin (MUCINEX) 600 MG 12 hr tablet Take 600 mg by mouth 2 (two) times daily.    . Immune Globulin-Hyaluronidase (HYQVIA) 10 GM/100ML KIT 50 g by Subcutaneous Infusion route every 28 (twenty-eight) days.    Marland Kitchen ipratropium-albuterol (DUONEB) 0.5-2.5 (3) MG/3ML SOLN Take 3 mLs by nebulization See admin instructions. Nebulize 3 ml's two to four times a day    . levocetirizine (XYZAL) 5 MG tablet Take 5 mg by mouth every evening.    Marland Kitchen lisinopril (PRINIVIL,ZESTRIL) 20 MG tablet Take 20 mg by mouth daily.    . meloxicam (MOBIC) 7.5 MG tablet Take 7.5 mg by mouth every evening.    . montelukast (SINGULAIR) 10 MG tablet Take 10 mg by mouth at bedtime.     . Multiple Vitamins-Minerals (MULTIVITAMIN PO) Take 1 tablet by mouth daily.    . Omalizumab (XOLAIR) 75 MG/0.5ML SOSY Inject 375 mg into the skin every 14 (fourteen) days.    Marland Kitchen omeprazole (PRILOSEC) 20 MG capsule Take 20 mg by mouth every evening.    Marland Kitchen oxyCODONE-acetaminophen (PERCOCET) 10-325 MG tablet Take 1 tablet by mouth every 6 (six) hours as needed for pain.    . pravastatin (PRAVACHOL) 40 MG tablet Take 40 mg by mouth at bedtime.     . Probiotic CAPS Take 1 capsule by mouth daily with breakfast.     . diphenhydrAMINE (BENADRYL) 25 mg capsule Take 25 mg by mouth every 6 (six) hours as needed for allergies.      Drug Regimen Review Drug regimen was reviewed and remains appropriate with no significant issues identified  Home: Home Living Family/patient expects to be discharged to:: Private residence Living Arrangements: Spouse/significant other Available Help at Discharge: Family, Available 24 hours/day Type of Home: House Home Access: Stairs to enter  Technical brewer of Steps: 1 Entrance Stairs-Rails: None Home Layout: One level Bathroom Shower/Tub: Multimedia programmer: Handicapped height Home Equipment: [place] - single point   Functional History: Prior Function Level of Independence: Independent Comments: retired and independent w/ driving  Functional Status:  Mobility: Bed Mobility Overal bed mobility: Needs Assistance Bed Mobility: Rolling, Sidelying to Sit Rolling: Min assist Sidelying to sit: Mod assist, HOB elevated Sit to supine: Mod assist General bed mobility comments: Cues for log roll technique; use of rail and assist to elevate trunk to get to EOB. Mild dizziness.  Transfers Overall transfer level: Needs assistance Equipment used: Rolling walker (2 wheeled) Transfer via Lift Equipment: Stedy Transfers: Sit to/from Stand Sit to Stand: Max assist, +2 physical assistance, From elevated surface General transfer comment: Assist of 2 to stand from EOB with cues for hand placement/technique; Heavy use of UEs, bil knee instability noted. Transferred to chair. Ambulation/Gait Ambulation/Gait assistance: Mod assist, +2 safety/equipment Gait  Distance (Feet): 4 Feet Assistive device: Rolling walker (2 wheeled) Gait Pattern/deviations: Trunk flexed, Step-to pattern General Gait Details: Able to take a few steps to get to chair with assist for balance; bil knee instability noted. Buckling started towards end when fatigued. Heavy reliance on UEs.     ADL: ADL Overall ADL's : Needs assistance/impaired Eating/Feeding: Set up, Sitting Grooming: Min guard, Sitting Upper Body Bathing: Minimal assistance, Sitting Lower Body Bathing: Maximal assistance, Sitting/lateral leans, Bed level Upper Body Dressing : Minimal assistance, Sitting Upper Body Dressing Details (indicate cue type and reason): donning brace Lower Body Dressing: Maximal assistance, Sitting/lateral leans Lower Body Dressing Details (indicate cue  type and reason): AE education to be provided in next sessions Toilet Transfer: (with stedy) Toileting- Clothing Manipulation and Hygiene: Maximal assistance, +2 for physical assistance, +2 for safety/equipment, Sitting/lateral lean, Sit to/from stand Functional mobility during ADLs: Minimal assistance, +2 for physical assistance, +2 for safety/equipment(w/ stedy) General ADL Comments: Pt requiring minA for donning brace and maxA for lB ADL; pt with BLE weakness and inability to perform figure 4 technique.  Cognition: Cognition Overall Cognitive Status: Within Functional Limits for tasks assessed Orientation Level: Oriented X4 Cognition Arousal/Alertness: Awake/alert Behavior During Therapy: WFL for tasks assessed/performed Overall Cognitive Status: Within Functional Limits for tasks assessed  Physical Exam: Blood pressure 134/82, pulse 73, temperature (!) 97.5 F (36.4 C), temperature source Oral, resp. rate 18, height _0  (1.778 m), weight 104.3 kg, SpO2 96 %. Physical Exam  Constitutional: He is oriented to person, place, and time. He appears well-developed and well-nourished.  HENT:  Head: Normocephalic.  Eyes: Pupils are equal, round, and reactive to light.  Neck: Normal range of motion.  Cardiovascular: Normal rate.  Murmur heard. Respiratory: Effort normal. No respiratory distress. He has no wheezes.  GI: Soft. He exhibits no distension. There is no abdominal tenderness.  Musculoskeletal:        General: Edema: LE edema.  Neurological: He is alert and oriented to person, place, and time. No cranial nerve deficit.  Patient is alert in no acute distress.  Follows full commands.UE 4/5. LE 3/5 HF, KE and 4-/5 ADF/PF. No focal sensory changes.   Skin: Skin is warm.  Back incision is dressed  Psychiatric: He has a normal mood and affect. His behavior is normal.    No results found for this or any previous visit (from the past 48 hour(s)). No results found.     Medical  Problem List and Plan: 1.  Decreased functional mobility secondary to lumbar stenosis, spondylolisthesis with radiculopathy.  Status post L2-3, 3-4 and 4-5 lumbar interbody fusion with pedicle screw fixation 04/27/2019.  Lumbar corset when out of bed  -admit to inpatient rehab 2.  Antithrombotics: -DVT/anticoagulation: SCDs.  Check vascular study  -antiplatelet therapy: N/A 3. Pain Management: Neurontin 300 mg 3 times daily, oxycodone and Robaxin as needed 4. Mood: Provide emotional support  -antipsychotic agents: N/A 5. Neuropsych: This patient is capable of making decisions on his own behalf. 6. Skin/Wound Care: Routine skin checks 7. Fluids/Electrolytes/Nutrition: Routine in and outs with follow-up chemistries 8.  Hypertension.  Norvasc 10 mg daily, lisinopril 20 mg daily.  Monitor with increased mobility 9.  COPD/asthma.  Continue nebulizers as directed 10.  Primary immunodeficiency.  Patient receives HYQVIA 50 g every 28 days with next scheduled dose 05/04/2019.  Wife is to bring in supply. Infusion process to be determined. (wife vs qualified nurse) 20.  GERD.  Protonix 12.  Hyperlipidemia.  Pravachol 13.  Constipation.  Laxative assistance       Cathlyn Parsons, PA-C 05/01/2019

## 2019-05-01 ENCOUNTER — Inpatient Hospital Stay (HOSPITAL_COMMUNITY)
Admission: RE | Admit: 2019-05-01 | Discharge: 2019-05-11 | DRG: 560 | Disposition: A | Discharge status: Home Health | Payer: Medicare Other | Source: Intra-hospital | Attending: Physical Medicine & Rehabilitation | Admitting: Physical Medicine & Rehabilitation

## 2019-05-01 ENCOUNTER — Other Ambulatory Visit: Payer: Self-pay

## 2019-05-01 ENCOUNTER — Encounter (HOSPITAL_COMMUNITY): Payer: Self-pay

## 2019-05-01 DIAGNOSIS — M7989 Other specified soft tissue disorders: Secondary | ICD-10-CM | POA: Diagnosis not present

## 2019-05-01 DIAGNOSIS — Z7951 Long term (current) use of inhaled steroids: Secondary | ICD-10-CM

## 2019-05-01 DIAGNOSIS — J209 Acute bronchitis, unspecified: Secondary | ICD-10-CM | POA: Diagnosis not present

## 2019-05-01 DIAGNOSIS — K5900 Constipation, unspecified: Secondary | ICD-10-CM | POA: Diagnosis present

## 2019-05-01 DIAGNOSIS — M5416 Radiculopathy, lumbar region: Secondary | ICD-10-CM | POA: Diagnosis present

## 2019-05-01 DIAGNOSIS — G4730 Sleep apnea, unspecified: Secondary | ICD-10-CM | POA: Diagnosis present

## 2019-05-01 DIAGNOSIS — K5901 Slow transit constipation: Secondary | ICD-10-CM

## 2019-05-01 DIAGNOSIS — I10 Essential (primary) hypertension: Secondary | ICD-10-CM | POA: Diagnosis present

## 2019-05-01 DIAGNOSIS — Z791 Long term (current) use of non-steroidal anti-inflammatories (NSAID): Secondary | ICD-10-CM | POA: Diagnosis not present

## 2019-05-01 DIAGNOSIS — Z4789 Encounter for other orthopedic aftercare: Principal | ICD-10-CM

## 2019-05-01 DIAGNOSIS — K219 Gastro-esophageal reflux disease without esophagitis: Secondary | ICD-10-CM | POA: Diagnosis present

## 2019-05-01 DIAGNOSIS — J449 Chronic obstructive pulmonary disease, unspecified: Secondary | ICD-10-CM | POA: Diagnosis present

## 2019-05-01 DIAGNOSIS — I270 Primary pulmonary hypertension: Secondary | ICD-10-CM | POA: Diagnosis not present

## 2019-05-01 DIAGNOSIS — E785 Hyperlipidemia, unspecified: Secondary | ICD-10-CM | POA: Diagnosis present

## 2019-05-01 DIAGNOSIS — Z888 Allergy status to other drugs, medicaments and biological substances status: Secondary | ICD-10-CM | POA: Diagnosis not present

## 2019-05-01 DIAGNOSIS — M1990 Unspecified osteoarthritis, unspecified site: Secondary | ICD-10-CM | POA: Diagnosis present

## 2019-05-01 DIAGNOSIS — Z87891 Personal history of nicotine dependence: Secondary | ICD-10-CM | POA: Diagnosis not present

## 2019-05-01 DIAGNOSIS — M48062 Spinal stenosis, lumbar region with neurogenic claudication: Secondary | ICD-10-CM | POA: Diagnosis not present

## 2019-05-01 DIAGNOSIS — D849 Immunodeficiency, unspecified: Secondary | ICD-10-CM | POA: Diagnosis present

## 2019-05-01 DIAGNOSIS — I82411 Acute embolism and thrombosis of right femoral vein: Secondary | ICD-10-CM | POA: Diagnosis present

## 2019-05-01 DIAGNOSIS — D72823 Leukemoid reaction: Secondary | ICD-10-CM | POA: Diagnosis not present

## 2019-05-01 DIAGNOSIS — Z833 Family history of diabetes mellitus: Secondary | ICD-10-CM | POA: Diagnosis not present

## 2019-05-01 DIAGNOSIS — R05 Cough: Secondary | ICD-10-CM

## 2019-05-01 DIAGNOSIS — R058 Other specified cough: Secondary | ICD-10-CM

## 2019-05-01 DIAGNOSIS — J440 Chronic obstructive pulmonary disease with acute lower respiratory infection: Secondary | ICD-10-CM | POA: Diagnosis not present

## 2019-05-01 DIAGNOSIS — Z825 Family history of asthma and other chronic lower respiratory diseases: Secondary | ICD-10-CM | POA: Diagnosis not present

## 2019-05-01 MED ORDER — SENNOSIDES-DOCUSATE SODIUM 8.6-50 MG PO TABS
1.0000 | ORAL_TABLET | Freq: Two times a day (BID) | ORAL | Status: DC
  Administered 2019-05-01 – 2019-05-11 (×19): 1 via ORAL
  Filled 2019-05-01 (×20): qty 1

## 2019-05-01 MED ORDER — MOMETASONE FURO-FORMOTEROL FUM 200-5 MCG/ACT IN AERO
2.0000 | INHALATION_SPRAY | Freq: Two times a day (BID) | RESPIRATORY_TRACT | Status: DC
  Administered 2019-05-01 – 2019-05-11 (×18): 2 via RESPIRATORY_TRACT
  Filled 2019-05-01: qty 8.8

## 2019-05-01 MED ORDER — FLUTICASONE PROPIONATE 50 MCG/ACT NA SUSP
1.0000 | Freq: Two times a day (BID) | NASAL | Status: DC
  Administered 2019-05-01 – 2019-05-11 (×20): 1 via NASAL
  Filled 2019-05-01: qty 16

## 2019-05-01 MED ORDER — SORBITOL 70 % SOLN
30.0000 mL | Freq: Every day | Status: DC | PRN
  Administered 2019-05-01: 22:00:00 30 mL via ORAL
  Filled 2019-05-01 (×2): qty 30

## 2019-05-01 MED ORDER — ONDANSETRON HCL 4 MG/2ML IJ SOLN: 4.0000 mg | Freq: Four times a day (QID) | INTRAMUSCULAR | Status: DC | PRN

## 2019-05-01 MED ORDER — AZELASTINE HCL 0.1 % NA SOLN
2.0000 | Freq: Two times a day (BID) | NASAL | Status: DC
  Administered 2019-05-01 – 2019-05-11 (×20): 2 via NASAL
  Filled 2019-05-01: qty 30

## 2019-05-01 MED ORDER — ACETAMINOPHEN 325 MG PO TABS
650.0000 mg | ORAL_TABLET | ORAL | Status: DC | PRN
  Administered 2019-05-06: 650 mg via ORAL
  Filled 2019-05-01: qty 2

## 2019-05-01 MED ORDER — LORATADINE 10 MG PO TABS
10.0000 mg | ORAL_TABLET | Freq: Every evening | ORAL | Status: DC
  Administered 2019-05-01 – 2019-05-10 (×10): 10 mg via ORAL
  Filled 2019-05-01 (×10): qty 1

## 2019-05-01 MED ORDER — RISAQUAD PO CAPS
1.0000 | ORAL_CAPSULE | Freq: Every day | ORAL | Status: DC
  Administered 2019-05-02 – 2019-05-11 (×10): 1 via ORAL
  Filled 2019-05-01 (×10): qty 1

## 2019-05-01 MED ORDER — ONDANSETRON HCL 4 MG PO TABS
4.0000 mg | ORAL_TABLET | Freq: Four times a day (QID) | ORAL | Status: DC | PRN
  Administered 2019-05-01 – 2019-05-04 (×2): 4 mg via ORAL
  Filled 2019-05-01 (×2): qty 1

## 2019-05-01 MED ORDER — OXYCODONE-ACETAMINOPHEN 5-325 MG PO TABS
1.0000 | ORAL_TABLET | ORAL | Status: DC | PRN
  Administered 2019-05-03 – 2019-05-10 (×17): 1 via ORAL
  Filled 2019-05-01 (×19): qty 1

## 2019-05-01 MED ORDER — GUAIFENESIN ER 600 MG PO TB12
600.0000 mg | ORAL_TABLET | Freq: Two times a day (BID) | ORAL | Status: DC
  Administered 2019-05-01 – 2019-05-11 (×20): 600 mg via ORAL
  Filled 2019-05-01 (×20): qty 1

## 2019-05-01 MED ORDER — VITAMIN D 25 MCG (1000 UNIT) PO TABS
5000.0000 [IU] | ORAL_TABLET | Freq: Every evening | ORAL | Status: DC
  Administered 2019-05-01 – 2019-05-10 (×10): 5000 [IU] via ORAL
  Filled 2019-05-01 (×10): qty 5

## 2019-05-01 MED ORDER — ACETAMINOPHEN 650 MG RE SUPP: 650.0000 mg | RECTAL | Status: DC | PRN

## 2019-05-01 MED ORDER — POLYETHYLENE GLYCOL 3350 17 G PO PACK
17.0000 g | PACK | Freq: Every day | ORAL | Status: DC | PRN
  Administered 2019-05-02: 17 g via ORAL
  Filled 2019-05-01: qty 1

## 2019-05-01 MED ORDER — BISACODYL 10 MG RE SUPP: 10.0000 mg | Freq: Every day | RECTAL | Status: DC | PRN

## 2019-05-01 MED ORDER — VITAMIN B-12 1000 MCG PO TABS
5000.0000 ug | ORAL_TABLET | Freq: Every evening | ORAL | Status: DC
  Administered 2019-05-01 – 2019-05-10 (×10): 5000 ug via ORAL
  Filled 2019-05-01 (×10): qty 5

## 2019-05-01 MED ORDER — PRAVASTATIN SODIUM 40 MG PO TABS
40.0000 mg | ORAL_TABLET | Freq: Every day | ORAL | Status: DC
  Administered 2019-05-01 – 2019-05-10 (×10): 40 mg via ORAL
  Filled 2019-05-01 (×10): qty 1

## 2019-05-01 MED ORDER — MONTELUKAST SODIUM 10 MG PO TABS
10.0000 mg | ORAL_TABLET | Freq: Every day | ORAL | Status: DC
  Administered 2019-05-01 – 2019-05-10 (×10): 10 mg via ORAL
  Filled 2019-05-01 (×10): qty 1

## 2019-05-01 MED ORDER — AMLODIPINE BESYLATE 10 MG PO TABS
10.0000 mg | ORAL_TABLET | Freq: Every evening | ORAL | Status: DC
  Administered 2019-05-01 – 2019-05-10 (×7): 10 mg via ORAL
  Filled 2019-05-01 (×9): qty 1

## 2019-05-01 MED ORDER — METHOCARBAMOL 500 MG PO TABS
500.0000 mg | ORAL_TABLET | Freq: Four times a day (QID) | ORAL | Status: DC | PRN
  Administered 2019-05-01 – 2019-05-09 (×6): 500 mg via ORAL
  Filled 2019-05-01 (×7): qty 1

## 2019-05-01 MED ORDER — OXYCODONE HCL 5 MG PO TABS
5.0000 mg | ORAL_TABLET | ORAL | Status: DC | PRN
  Administered 2019-05-01 – 2019-05-11 (×19): 5 mg via ORAL
  Filled 2019-05-01 (×19): qty 1

## 2019-05-01 MED ORDER — GABAPENTIN 300 MG PO CAPS
300.0000 mg | ORAL_CAPSULE | Freq: Three times a day (TID) | ORAL | Status: DC
  Administered 2019-05-01 – 2019-05-11 (×29): 300 mg via ORAL
  Filled 2019-05-01 (×29): qty 1

## 2019-05-01 MED ORDER — LISINOPRIL 20 MG PO TABS
20.0000 mg | ORAL_TABLET | Freq: Every day | ORAL | Status: DC
  Administered 2019-05-02 – 2019-05-11 (×9): 20 mg via ORAL
  Filled 2019-05-01 (×10): qty 1

## 2019-05-01 MED ORDER — METHOCARBAMOL 1000 MG/10ML IJ SOLN
500.0000 mg | Freq: Four times a day (QID) | INTRAVENOUS | Status: DC | PRN
  Filled 2019-05-01: qty 5

## 2019-05-01 MED ORDER — PANTOPRAZOLE SODIUM 40 MG PO TBEC
40.0000 mg | DELAYED_RELEASE_TABLET | Freq: Every day | ORAL | Status: DC
  Administered 2019-05-02 – 2019-05-11 (×10): 40 mg via ORAL
  Filled 2019-05-01 (×10): qty 1

## 2019-05-01 MED ORDER — ALBUTEROL SULFATE (2.5 MG/3ML) 0.083% IN NEBU
3.0000 mL | INHALATION_SOLUTION | Freq: Four times a day (QID) | RESPIRATORY_TRACT | Status: DC | PRN
  Administered 2019-05-04 – 2019-05-11 (×2): 3 mL via RESPIRATORY_TRACT
  Filled 2019-05-01: qty 3

## 2019-05-01 MED FILL — Sodium Chloride IV Soln 0.9%: INTRAVENOUS | Qty: 2000 | Status: AC

## 2019-05-01 MED FILL — Heparin Sodium (Porcine) Inj 1000 Unit/ML: INTRAMUSCULAR | Qty: 30 | Status: AC

## 2019-05-01 NOTE — Care Management Important Message (Signed)
Important Message  Patient Details  Name: Patrick Waters MRN: [id number] Date of Birth: Jul 25, 1951   Medicare Important Message Given:  Yes    Memory Argue 05/01/2019, 2:04 PM

## 2019-05-01 NOTE — Discharge Summary (Signed)
Physician Discharge Summary  Patient ID: Patrick Waters MRN: [id number] DOB/AGE: Mar 28, 1951 68 y.o.  Admit date: 04/27/2019 Discharge date: 05/01/2019  Admission Diagnoses: Lumbar foraminal stenosis, spondylolisthesis, herniated disc, scoliosis, lumbago, radiculopathy L 23, L 34, L 45 levels    Discharge Diagnoses: Lumbar foraminal stenosis, spondylolisthesis, herniated disc, scoliosis, lumbago, radiculopathy L 23, L 34, L 45 levels s/p Left Lumbar two-three Lumbar three-four Lumbar four-five Transforaminal lumbar interbody fusion with redo decompression, pedicle screw fixation Lumbar two to Lumbar five (Left) - Left Lumbar two-three Lumbar three-four Lumbar four-five Transforaminal lumbar interbody fusion with redo decompression, pedicle screw fixation Lumbar two to Lumbar five with Expandable TLIF cages, autograft, pedicle screw fixation, posterolateral arthrodesis     Active Problems:   Idiopathic scoliosis of thoracolumbar region   Discharged Condition: stable  Hospital Course: Patrick Waters was admitted with dx stenosis, HNP, and lumbar radiculopathy. Following uncomplicated surgery (above), he recovered nicely and transferred to [facility] for nursing care and therapies. Mobilizing slowly initially, he did well with a short course of Decadron. His stamina and endurance remain low; and will benefit from Inpatient Rehab.  Consults: rehabilitation medicine  Significant Diagnostic Studies: radiology: X-[place]: intra-op  Treatments: surgery: Left Lumbar two-three Lumbar three-four Lumbar four-five Transforaminal lumbar interbody fusion with redo decompression, pedicle screw fixation Lumbar two to Lumbar five (Left) - Left Lumbar two-three Lumbar three-four Lumbar four-five Transforaminal lumbar interbody fusion with redo decompression, pedicle screw fixation Lumbar two to Lumbar five with Expandable TLIF cages, autograft, pedicle screw fixation, posterolateral arthrodesis    Discharge  Exam: Blood pressure 134/82, pulse 73, temperature (!) 97.5 F (36.4 C), temperature source Oral, resp. rate 18, height '5\' 10"'  (1.778 m), weight 104.3 kg, SpO2 96 %. Alert, conversant. MAEW, good strength BLE. Incisdion flat without erythema or drainage beneath honeycomb and Dermabond. Hopeful of CIR today.    Disposition: Discharge disposition: [place] Not Defined Discharge to [facility]. Office f/u 3 weeks after discharge with x-rays.       Discharge Instructions    Diet - low sodium heart healthy   Complete by:  As directed    Increase activity slowly   Complete by:  As directed      Allergies as of 05/01/2019      Reactions   Hydrochlorothiazide Hives, Rash   Meperidine Nausea And Vomiting      Medication List    STOP taking these medications   meloxicam 7.5 MG tablet Commonly known as:  MOBIC     TAKE these medications   albuterol 108 (90 Base) MCG/ACT inhaler Commonly known as:  VENTOLIN HFA Inhale 2 puffs into the lungs every 6 (six) hours as needed for wheezing or shortness of breath.   amLODipine 10 MG tablet Commonly known as:  NORVASC Take 10 mg by mouth daily.   azelastine 0.1 % nasal spray Commonly known as:  ASTELIN Place 2 sprays into both nostrils 2 (two) times daily.   budesonide-formoterol 160-4.5 MCG/ACT inhaler Commonly known as:  SYMBICORT Inhale 2 puffs into the lungs 2 (two) times daily.   CALCIUM + D3 PO Take 1 tablet by mouth daily with breakfast.   Co Q 10 100 MG Caps Take 100 mg by mouth daily.   diphenhydrAMINE 25 mg capsule Commonly known as:  BENADRYL Take 25 mg by mouth every 6 (six) hours as needed for allergies.   fluticasone 50 MCG/ACT nasal spray Commonly known as:  FLONASE Place 1 spray into both nostrils 2 (two) times  daily.   guaiFENesin 600 MG 12 hr tablet Commonly known as:  MUCINEX Take 600 mg by mouth 2 (two) times daily.   Hyqvia 10 GM/100ML Kit Generic drug:   Immune Globulin-Hyaluronidase 50 g by Subcutaneous Infusion route every 28 (twenty-eight) days.   ipratropium-albuterol 0.5-2.5 (3) MG/3ML Soln Commonly known as:  DUONEB Take 3 mLs by nebulization See admin instructions. Nebulize 3 ml's two to four times a day   levocetirizine 5 MG tablet Commonly known as:  XYZAL Take 5 mg by mouth every evening.   lisinopril 20 MG tablet Commonly known as:  ZESTRIL Take 20 mg by mouth daily.   montelukast 10 MG tablet Commonly known as:  SINGULAIR Take 10 mg by mouth at bedtime.   MULTIVITAMIN PO Take 1 tablet by mouth daily.   omeprazole 20 MG capsule Commonly known as:  PRILOSEC Take 20 mg by mouth every evening.   oxyCODONE-acetaminophen 10-325 MG tablet Commonly known as:  PERCOCET Take 1 tablet by mouth every 6 (six) hours as needed for pain.   pravastatin 40 MG tablet Commonly known as:  PRAVACHOL Take 40 mg by mouth at bedtime.   Probiotic Caps Take 1 capsule by mouth daily with breakfast.   Vitamin B-12 5000 MCG Tbdp Take 5,000 mcg by mouth every evening.   Vitamin D3 125 MCG (5000 UT) Caps Take 5,000 Units by mouth every evening.   Xolair 75 MG/0.5ML prefilled syringe Generic drug:  omalizumab Inject 375 mg into the skin every 14 (fourteen) days.        Signed: Peggyann Shoals, MD 05/01/2019, 7:52 AM

## 2019-05-01 NOTE — PMR Pre-admission (Signed)
PMR Admission Coordinator Pre-Admission Assessment  Patient: Patrick Waters is an 68 y.o., male MRN: [id number] DOB: 1951-11-18 Height: _0  (177.8 cm) Weight: 104.3 kg  Insurance Information HMO:     PPO:      PCP:      IPA:      80/20:     OTHER:  PRIMARY: Medicare A and B      Policy#: [id number]      Subscriber: patient CM Name:       Phone#:      Fax#:  Pre-Cert#: verified eligibility via passport onesource      Employer:  Benefits:  Phone #:      Name:  Eff. Date: part A 08/22/14, Part B 11/23/15     Deduct: $1408      Out of Pocket Max: n/a      Life Max: n/a CIR: 100%      SNF: 20 full days Outpatient: 80%     Co-Pay: 20% Home Health: 100%      Co-Pay:  DME: 80%     Co-Pay: 20% Providers: patient choice  SECONDARY: Medicare supplement, Plan G  Medicaid Application Date:       Case Manager:  Disability Application Date:       Case Worker:   The "Data Collection Information Summary" for patients in Inpatient Rehabilitation Facilities with attached "Privacy Act [place] Records" was provided and verbally reviewed with: Patient  Emergency Contact Information Contact Information    Name Relation Home Work Mobile   DAKARI, CREGGER [phone]  [phone]      Current Medical History  Patient Admitting Diagnosis: s/p lumbar interbody fusion L2-3, 3-4, 4-5.  History of Present Illness: Patrick Waters is a 68 year old right-handed male history of COPD, prior lumbar and cervical back surgeries.  Per chart review patient lives with spouse.  He is retired independent driving.  One level home with one-step to entry.  Presented 04/27/2019 with persistent low back pain radiating to the lower extremities.  X-rays and imaging revealed lumbar foraminal stenosis, spondylolisthesis, herniated disc, scoliosis with lumbar radiculopathy L2-3, 3-4 and 4-5.  Underwent lumbar 2-3, 3-4 and 4-5 transforaminal lumbar interbody fusion with redo decompression, pedicle screw fixation 04/27/2019  per Dr. Vertell Limber.  Hospital course pain management.  Lumbar corset when out of bed.  Tolerating a regular diet.  Bouts of constipation with bowel program adjusted.  Therapy evaluations completed and patient was admitted for a comprehensive rehab program.    Patient's medical record from [facility] has been reviewed by the rehabilitation admission coordinator and physician.  Past Medical History  Past Medical History:  Diagnosis Date  . Arthritis   . Asthma   . COPD (chronic obstructive pulmonary disease) ([place])   . Dyspnea    with exertion  . Family history of adverse reaction to anesthesia    Daughter- N/V  . GERD (gastroesophageal reflux disease)   . Hypertension   . Pneumonia   . Sleep apnea    uses cpap    Family History   family history includes COPD in his father; Diabetes in his brother.  Prior Rehab/Hospitalizations Has the patient had prior rehab or hospitalizations prior to admission? No  Has the patient had major surgery during 100 days prior to admission? Yes   Current Medications  Current Facility-Administered Medications:  .  0.9 %  sodium chloride infusion, 250 mL, Intravenous, Continuous, Erline Levine, MD .  acetaminophen (TYLENOL) tablet 650 mg, 650 mg, Oral, Q4H  PRN, 650 mg at 04/30/19 1223 **OR** acetaminophen (TYLENOL) suppository 650 mg, 650 mg, Rectal, Q4H PRN, Erline Levine, MD .  acidophilus (RISAQUAD) capsule 1 capsule, 1 capsule, Oral, Daily, Erline Levine, MD, 1 capsule at 05/01/19 0809 .  albuterol (PROVENTIL) (2.5 MG/3ML) 0.083% nebulizer solution 3 mL, 3 mL, Inhalation, Q6H PRN, Erline Levine, MD .  alum & mag hydroxide-simeth (MAALOX/MYLANTA) 200-200-20 MG/5ML suspension 30 mL, 30 mL, Oral, Q6H PRN, Erline Levine, MD, 30 mL at 05/01/19 1337 .  amLODipine (NORVASC) tablet 10 mg, 10 mg, Oral, QPM, Erline Levine, MD, 10 mg at 04/30/19 1803 .  azelastine (ASTELIN) 0.1 % nasal spray 2 spray, 2 spray, Each Nare, BID, Erline Levine, MD, 2 spray at  05/01/19 0809 .  bisacodyl (DULCOLAX) suppository 10 mg, 10 mg, Rectal, Daily PRN, Erline Levine, MD .  cholecalciferol (VITAMIN D3) tablet 5,000 Units, 5,000 Units, Oral, QPM, Erline Levine, MD, 5,000 Units at 04/30/19 1801 .  dextrose 5 % and 0.45 % NaCl with KCl 20 mEq/L infusion, , Intravenous, Continuous, Erline Levine, MD, Last Rate: 75 mL/hr at 04/30/19 2325 .  diphenhydrAMINE (BENADRYL) capsule 25 mg, 25 mg, Oral, Q6H PRN, Erline Levine, MD .  docusate sodium (COLACE) capsule 100 mg, 100 mg, Oral, BID, Erline Levine, MD, 100 mg at 05/01/19 0809 .  fluticasone (FLONASE) 50 MCG/ACT nasal spray 1 spray, 1 spray, Each Nare, BID, Erline Levine, MD, 1 spray at 05/01/19 0820 .  gabapentin (NEURONTIN) capsule 300 mg, 300 mg, Oral, TID, Erline Levine, MD, 300 mg at 05/01/19 8144 .  guaiFENesin (MUCINEX) 12 hr tablet 600 mg, 600 mg, Oral, BID, Erline Levine, MD, 600 mg at 05/01/19 0810 .  HYDROcodone-acetaminophen (NORCO) 10-325 MG per tablet 1 tablet, 1 tablet, Oral, Q4H PRN, Erline Levine, MD .  lisinopril (ZESTRIL) tablet 20 mg, 20 mg, Oral, Daily, Erline Levine, MD, 20 mg at 05/01/19 0810 .  loratadine (CLARITIN) tablet 10 mg, 10 mg, Oral, QPM, Erline Levine, MD, 10 mg at 04/30/19 1803 .  menthol-cetylpyridinium (CEPACOL) lozenge 3 mg, 1 lozenge, Oral, PRN **OR** phenol (CHLORASEPTIC) mouth spray 1 spray, 1 spray, Mouth/Throat, PRN, Erline Levine, MD .  methocarbamol (ROBAXIN) tablet 500 mg, 500 mg, Oral, Q6H PRN, 500 mg at 05/01/19 0827 **OR** methocarbamol (ROBAXIN) 500 mg in dextrose 5 % 50 mL IVPB, 500 mg, Intravenous, Q6H PRN, Erline Levine, MD, Stopped at 04/28/19 0431 .  mometasone-formoterol (DULERA) 200-5 MCG/ACT inhaler 2 puff, 2 puff, Inhalation, BID, Erline Levine, MD, 2 puff at 05/01/19 0810 .  montelukast (SINGULAIR) tablet 10 mg, 10 mg, Oral, QHS, Erline Levine, MD, 10 mg at 04/30/19 2258 .  morphine 2 MG/ML injection 2 mg, 2 mg, Intravenous, Q2H PRN, Erline Levine, MD .   multivitamin liquid, , Oral, Daily, Erline Levine, MD .  ondansetron [facility]) tablet 4 mg, 4 mg, Oral, Q6H PRN, 4 mg at 05/01/19 1150 **OR** ondansetron (ZOFRAN) injection 4 mg, 4 mg, Intravenous, Q6H PRN, Erline Levine, MD, 4 mg at 04/28/19 0050 .  oxyCODONE (Oxy IR/ROXICODONE) immediate release tablet 10 mg, 10 mg, Oral, Q3H PRN, Erline Levine, MD, 10 mg at 05/01/19 1145 .  oxyCODONE-acetaminophen (PERCOCET/ROXICET) 5-325 MG per tablet 1 tablet, 1 tablet, Oral, Q4H PRN, 1 tablet at 04/30/19 0501 **AND** oxyCODONE (Oxy IR/ROXICODONE) immediate release tablet 5 mg, 5 mg, Oral, Q4H PRN, Skeet Simmer, RPH .  pantoprazole (PROTONIX) EC tablet 40 mg, 40 mg, Oral, Daily, Erline Levine, MD, 40 mg at 05/01/19 0811 .  polyethylene glycol (MIRALAX / GLYCOLAX) packet  17 g, 17 g, Oral, Daily PRN, Erline Levine, MD, 17 g at 05/01/19 9735 .  pravastatin (PRAVACHOL) tablet 40 mg, 40 mg, Oral, q1800, Erline Levine, MD, 40 mg at 04/30/19 1803 .  sodium chloride flush (NS) 0.9 % injection 3 mL, 3 mL, Intravenous, Q12H, Erline Levine, MD, 3 mL at 05/01/19 0811 .  sodium chloride flush (NS) 0.9 % injection 3 mL, 3 mL, Intravenous, PRN, Erline Levine, MD .  sodium phosphate (FLEET) 7-19 GM/118ML enema 1 enema, 1 enema, Rectal, Once PRN, Erline Levine, MD .  vitamin B-12 (CYANOCOBALAMIN) tablet 5,000 mcg, 5,000 mcg, Oral, QPM, Erline Levine, MD, 5,000 mcg at 04/30/19 1801 .  zolpidem (AMBIEN) tablet 5 mg, 5 mg, Oral, QHS PRN, Erline Levine, MD  Patients Current Diet:  Diet Order            Diet - low sodium heart healthy        Diet regular Room service appropriate? Yes with Assist; Fluid consistency: Thin  Diet effective now              Precautions / Restrictions Precautions Precautions: Fall, Back Precaution Booklet Issued: No Precaution Comments: reviewed precautions with pt Spinal Brace: Lumbar corset Restrictions Weight Bearing Restrictions: No   Has the patient had 2 or more falls or a fall  with injury in the past year? No  Prior Activity Level Community (5-7x/wk): active, driving prior to surgery  Prior Functional Level Self Care: Did the patient need help bathing, dressing, using the toilet or eating? Independent  Indoor Mobility: Did the patient need assistance with walking from room to room (with or without device)? Independent  Stairs: Did the patient need assistance with internal or external stairs (with or without device)? Independent  Functional Cognition: Did the patient need help planning regular tasks such as shopping or remembering to take medications? Independent  Home Assistive Devices / Equipment Home Assistive Devices/Equipment: Eyeglasses, CPAP Home Equipment: Cane - single point  Prior Device Use: Indicate devices/aids used by the patient prior to current illness, exacerbation or injury? None of the above  Current Functional Level Cognition  Overall Cognitive Status: Within Functional Limits for tasks assessed Orientation Level: Oriented X4    Extremity Assessment (includes Sensation/Coordination)  Upper Extremity Assessment: Overall WFL for tasks assessed  Lower Extremity Assessment: Generalized weakness, RLE deficits/detail, LLE deficits/detail RLE Deficits / Details: knee ext 3-/5 but buckling in [place] RLE Sensation: WNL RLE Coordination: decreased gross motor LLE Deficits / Details: knee ext 3-/5 but buckling in [place] LLE Sensation: WNL LLE Coordination: decreased gross motor    ADLs  Overall ADL's : Needs assistance/impaired Eating/Feeding: Set up, Sitting Grooming: Min guard, Sitting Upper Body Bathing: Minimal assistance, Sitting Lower Body Bathing: Maximal assistance, Sitting/lateral leans, Bed level Upper Body Dressing : Minimal assistance, Sitting Upper Body Dressing Details (indicate cue type and reason): donning brace Lower Body Dressing: Maximal assistance, Sitting/lateral leans Lower Body Dressing Details (indicate cue type  and reason): AE education to be provided in next sessions Toilet Transfer: (with stedy) Toileting- Clothing Manipulation and Hygiene: Maximal assistance, +2 for physical assistance, +2 for safety/equipment, Sitting/lateral lean, Sit to/from stand Functional mobility during ADLs: Minimal assistance, +2 for physical assistance, +2 for safety/equipment(w/ stedy) General ADL Comments: Pt requiring minA for donning brace and maxA for lB ADL; pt with BLE weakness and inability to perform figure 4 technique.    Mobility  Overal bed mobility: Needs Assistance Bed Mobility: Rolling, Sidelying to Sit Rolling: Min assist Sidelying to  sit: Mod assist, HOB elevated Sit to supine: Mod assist General bed mobility comments: Cues for log roll technique; use of rail and assist to elevate trunk to get to EOB. Mild dizziness.     Transfers  Overall transfer level: Needs assistance Equipment used: Rolling walker (2 wheeled) Transfer via Lift Equipment: Stedy Transfers: Sit to/from Stand Sit to Stand: Max assist, +2 physical assistance, From elevated surface General transfer comment: Assist of 2 to stand from EOB with cues for hand placement/technique; Heavy use of UEs, bil knee instability noted. Transferred to chair.    Ambulation / Gait / Stairs / Wheelchair Mobility  Ambulation/Gait Ambulation/Gait assistance: Mod assist, +2 safety/equipment Gait Distance (Feet): 4 Feet Assistive device: Rolling walker (2 wheeled) Gait Pattern/deviations: Trunk flexed, Step-to pattern General Gait Details: Able to take a few steps to get to chair with assist for balance; bil knee instability noted. Buckling started towards end when fatigued. Heavy reliance on UEs.     Posture / Balance Dynamic Sitting Balance Sitting balance - Comments: Assist to donn brace.  Balance Overall balance assessment: Needs assistance Sitting-balance support: Feet supported, No upper extremity supported Sitting balance-Leahy Scale:  Fair Sitting balance - Comments: Assist to donn brace.  Standing balance support: Bilateral upper extremity supported, During functional activity Standing balance-Leahy Scale: Poor Standing balance comment: Reliant on BUEs and external support in standing. Bil knee instability noted. marching in standing ~20 seconds.     Special needs/care consideration BiPAP/CPAP wears CPAP at night at home, but has not worn it in the hospital CPM no Continuous Drip IV no Dialysis no        Days n/a Life Vest no Oxygen no Special Bed no Trach Size no Wound Vac (area) no      Location n/a Skin surgical incision to back                             Bowel mgmt: continent, last BM 04/30/2019 Bladder mgmt: continent Diabetic mgmt: no Behavioral consideration no Chemo/radiation no   Previous Home Environment (from acute therapy documentation) Living Arrangements: Spouse/significant other Available Help at Discharge: Family, Available 24 hours/day Type of Home: House Home Layout: One level Home Access: Stairs to enter Entrance Stairs-Rails: None Entrance Stairs-Number of Steps: 1 Bathroom Shower/Tub: Multimedia programmer: Handicapped height Home Care Services: No  Discharge Living Setting Plans for Discharge Living Setting: Patient's home Type of Home at Discharge: House Discharge Home Layout: One level Discharge Home Access: Stairs to enter Entrance Stairs-Rails: None Entrance Stairs-Number of Steps: 1 Discharge Bathroom Shower/Tub: Walk-in shower Discharge Bathroom Toilet: Standard Discharge Bathroom Accessibility: Yes How Accessible: Accessible via walker Does the patient have any problems obtaining your medications?: No  Social/Family/Support Systems Patient Roles: Spouse Anticipated Caregiver: pt's spouse, Juliann Pulse Anticipated Ambulance person Information: [phone] Ability/Limitations of Caregiver: none Caregiver Availability: 24/7 Discharge Plan Discussed with Primary  Caregiver: Yes Is Caregiver In Agreement with Plan?: Yes Does Caregiver/Family have Issues with Lodging/Transportation while Pt is in Rehab?: No  Goals/Additional Needs Patient/Family Goal for Rehab: PT/OT mod I w/c level, supervision ambulatory Expected length of stay: 14-18 days Dietary Needs: reg/thin Pt/Family Agrees to Admission and willing to participate: Yes Program Orientation Provided & Reviewed with Pt/Caregiver Including Roles  & Responsibilities: Yes   Possible need for SNF placement upon discharge: not anticipated  Patient Condition: I have reviewed medical records from [facility], spoken with CM, and patient and spouse. I met  with patient at the bedside and discussed with wife via phone for inpatient rehabilitation assessment.  Patient will benefit from ongoing PT and OT, can actively participate in 3 hours of therapy a day 5 days of the week, and can make measurable gains during the admission.  Patient will also benefit from the coordinated team approach during an Inpatient Acute Rehabilitation admission.  The patient will receive intensive therapy as well as Rehabilitation physician, nursing, social worker, and care management interventions.  Due to safety, skin/wound care, medication administration, pain management and patient education the patient requires 24 hour a day rehabilitation nursing.  The patient is currently mod/max with mobility and basic ADLs.  Discharge setting and therapy post discharge at home with home health is anticipated.  Patient has agreed to participate in the Acute Inpatient Rehabilitation Program and will admit today.  Preadmission Screen Completed By:  Michel Santee, PT, DPT 05/01/2019 2:08 PM ______________________________________________________________________   Discussed status with Dr. Naaman Plummer on 05/01/19  at 2:08 PM  and received approval for admission today.  Admission Coordinator:  Michel Santee, PT, DPT time 2:08 PM Sudie Grumbling 05/01/19     Assessment/Plan: Diagnosis: lumbar stenosis with radiculopathy 1. Does the need for close, 24 hr/day Medical supervision in concert with the patient's rehab needs make it unreasonable for this patient to be served in a less intensive setting? Yes 2. Co-Morbidities requiring supervision/potential complications: COPD, HTN 3. Due to bladder management, bowel management, safety, skin/wound care, disease management, medication administration, pain management and patient education, does the patient require 24 hr/day rehab nursing? Yes 4. Does the patient require coordinated care of a physician, rehab nurse, PT (1-2 hrs/day, 5 days/week) and OT (1-2 hrs/day, 5 days/week) to address physical and functional deficits in the context of the above medical diagnosis(es)? Yes Addressing deficits in the following areas: balance, endurance, locomotion, strength, transferring, bowel/bladder control, bathing, dressing, feeding, grooming, toileting and psychosocial support 5. Can the patient actively participate in an intensive therapy program of at least 3 hrs of therapy 5 days a week? Yes 6. The potential for patient to make measurable gains while on inpatient rehab is excellent 7. Anticipated functional outcomes upon discharge from inpatients are: modified independent PT, modified independent OT, n/a SLP 8. Estimated rehab length of stay to reach the above functional goals is: 14-18 days 9. Anticipated D/C setting: Home 10. Anticipated post D/C treatments: [place] therapy 11. Overall Rehab/Functional Prognosis: excellent  MD Signature: Meredith Staggers, MD, [place] [facility] 05/01/2019

## 2019-05-01 NOTE — Progress Notes (Signed)
Meredith Staggers, MD  Physician  Physical Medicine and Rehabilitation  PMR Pre-admission  Signed  Date of Service:  05/01/2019 9:24 AM       Related encounter: Admission (Current) from 04/27/2019 in [place]         Show:Clear all '[x]' Manual'[x]' Template'[x]' Copied  Added by: '[x]' Meredith Staggers, MD'[x]' Michel Santee, PT  '[]' Hover for details PMR Admission Coordinator Pre-Admission Assessment  Patient: Patrick Waters is an 68 y.o., male MRN: [id number] DOB: 1951/08/14 Height: '5\' 10"'  (177.8 cm) Weight: 104.3 kg  Insurance Information HMO:     PPO:      PCP:      IPA:      80/20:     OTHER:  PRIMARY: Medicare A and B      Policy#: [id number]      Subscriber: patient CM Name:       Phone#:      Fax#:  Pre-Cert#: verified eligibility via passport onesource      Employer:  Benefits:  Phone #:      Name:  Eff. Date: part A 08/22/14, Part B 11/23/15     Deduct: $1408      Out of Pocket Max: n/a      Life Max: n/a CIR: 100%      SNF: 20 full days Outpatient: 80%     Co-Pay: 20% Home Health: 100%      Co-Pay:  DME: 80%     Co-Pay: 20% Providers: patient choice  SECONDARY: Medicare supplement, Plan G  Medicaid Application Date:       Case Manager:  Disability Application Date:       Case Worker:   The "Data Collection Information Summary" for patients in Inpatient Rehabilitation Facilities with attached "Privacy Act [place] Records" was provided and verbally reviewed with: Patient  Emergency Contact Information         Contact Information    Name Relation Home Work Mobile   JOSSUE, RUBENSTEIN [phone]  [phone]      Current Medical History  Patient Admitting Diagnosis: s/p lumbar interbody fusion L2-3, 3-4, 4-5.  History of Present Illness: Patrick Waters is a 69 year old right-handed male history of COPD, prior lumbar and cervical back surgeries. Per chart review patient lives with spouse. He is  retired independent driving. One level home with one-step to entry. Presented 04/27/2019 with persistent low back pain radiating to the lower extremities. X-rays and imaging revealed lumbar foraminal stenosis, spondylolisthesis, herniated disc, scoliosis with lumbar radiculopathy L2-3, 3-4 and 4-5. Underwent lumbar 2-3, 3-4 and 4-5 transforaminal lumbar interbody fusion with redo decompression, pedicle screw fixation 04/27/2019 per Dr. Vertell Limber. Hospital course pain management. Lumbar corset when out of bed. Tolerating a regular diet. Bouts of constipation with bowel program adjusted. Therapy evaluations completed and patient was admitted for a comprehensive rehab program.  Patient's medical record from [facility] has been reviewed by the rehabilitation admission coordinator and physician.  Past Medical History      Past Medical History:  Diagnosis Date  . Arthritis   . Asthma   . COPD (chronic obstructive pulmonary disease) ([place])   . Dyspnea    with exertion  . Family history of adverse reaction to anesthesia    Daughter- N/V  . GERD (gastroesophageal reflux disease)   . Hypertension   . Pneumonia   . Sleep apnea    uses cpap    Family History   family history  includes COPD in his father; Diabetes in his brother.  Prior Rehab/Hospitalizations Has the patient had prior rehab or hospitalizations prior to admission? No  Has the patient had major surgery during 100 days prior to admission? Yes             Current Medications  Current Facility-Administered Medications:  .  0.9 %  sodium chloride infusion, 250 mL, Intravenous, Continuous, Erline Levine, MD .  acetaminophen (TYLENOL) tablet 650 mg, 650 mg, Oral, Q4H PRN, 650 mg at 04/30/19 1223 **OR** acetaminophen (TYLENOL) suppository 650 mg, 650 mg, Rectal, Q4H PRN, Erline Levine, MD .  acidophilus (RISAQUAD) capsule 1 capsule, 1 capsule, Oral, Daily, Erline Levine, MD, 1 capsule at 05/01/19 0809 .   albuterol (PROVENTIL) (2.5 MG/3ML) 0.083% nebulizer solution 3 mL, 3 mL, Inhalation, Q6H PRN, Erline Levine, MD .  alum & mag hydroxide-simeth (MAALOX/MYLANTA) 200-200-20 MG/5ML suspension 30 mL, 30 mL, Oral, Q6H PRN, Erline Levine, MD, 30 mL at 05/01/19 1337 .  amLODipine (NORVASC) tablet 10 mg, 10 mg, Oral, QPM, Erline Levine, MD, 10 mg at 04/30/19 1803 .  azelastine (ASTELIN) 0.1 % nasal spray 2 spray, 2 spray, Each Nare, BID, Erline Levine, MD, 2 spray at 05/01/19 0809 .  bisacodyl (DULCOLAX) suppository 10 mg, 10 mg, Rectal, Daily PRN, Erline Levine, MD .  cholecalciferol (VITAMIN D3) tablet 5,000 Units, 5,000 Units, Oral, QPM, Erline Levine, MD, 5,000 Units at 04/30/19 1801 .  dextrose 5 % and 0.45 % NaCl with KCl 20 mEq/L infusion, , Intravenous, Continuous, Erline Levine, MD, Last Rate: 75 mL/hr at 04/30/19 2325 .  diphenhydrAMINE (BENADRYL) capsule 25 mg, 25 mg, Oral, Q6H PRN, Erline Levine, MD .  docusate sodium (COLACE) capsule 100 mg, 100 mg, Oral, BID, Erline Levine, MD, 100 mg at 05/01/19 0809 .  fluticasone (FLONASE) 50 MCG/ACT nasal spray 1 spray, 1 spray, Each Nare, BID, Erline Levine, MD, 1 spray at 05/01/19 0820 .  gabapentin (NEURONTIN) capsule 300 mg, 300 mg, Oral, TID, Erline Levine, MD, 300 mg at 05/01/19 9563 .  guaiFENesin (MUCINEX) 12 hr tablet 600 mg, 600 mg, Oral, BID, Erline Levine, MD, 600 mg at 05/01/19 0810 .  HYDROcodone-acetaminophen (NORCO) 10-325 MG per tablet 1 tablet, 1 tablet, Oral, Q4H PRN, Erline Levine, MD .  lisinopril (ZESTRIL) tablet 20 mg, 20 mg, Oral, Daily, Erline Levine, MD, 20 mg at 05/01/19 0810 .  loratadine (CLARITIN) tablet 10 mg, 10 mg, Oral, QPM, Erline Levine, MD, 10 mg at 04/30/19 1803 .  menthol-cetylpyridinium (CEPACOL) lozenge 3 mg, 1 lozenge, Oral, PRN **OR** phenol (CHLORASEPTIC) mouth spray 1 spray, 1 spray, Mouth/Throat, PRN, Erline Levine, MD .  methocarbamol (ROBAXIN) tablet 500 mg, 500 mg, Oral, Q6H PRN, 500 mg at 05/01/19 0827  **OR** methocarbamol (ROBAXIN) 500 mg in dextrose 5 % 50 mL IVPB, 500 mg, Intravenous, Q6H PRN, Erline Levine, MD, Stopped at 04/28/19 0431 .  mometasone-formoterol (DULERA) 200-5 MCG/ACT inhaler 2 puff, 2 puff, Inhalation, BID, Erline Levine, MD, 2 puff at 05/01/19 0810 .  montelukast (SINGULAIR) tablet 10 mg, 10 mg, Oral, QHS, Erline Levine, MD, 10 mg at 04/30/19 2258 .  morphine 2 MG/ML injection 2 mg, 2 mg, Intravenous, Q2H PRN, Erline Levine, MD .  multivitamin liquid, , Oral, Daily, Erline Levine, MD .  ondansetron [facility]) tablet 4 mg, 4 mg, Oral, Q6H PRN, 4 mg at 05/01/19 1150 **OR** ondansetron (ZOFRAN) injection 4 mg, 4 mg, Intravenous, Q6H PRN, Erline Levine, MD, 4 mg at 04/28/19 0050 .  oxyCODONE (Oxy IR/ROXICODONE) immediate  release tablet 10 mg, 10 mg, Oral, Q3H PRN, Erline Levine, MD, 10 mg at 05/01/19 1145 .  oxyCODONE-acetaminophen (PERCOCET/ROXICET) 5-325 MG per tablet 1 tablet, 1 tablet, Oral, Q4H PRN, 1 tablet at 04/30/19 0501 **AND** oxyCODONE (Oxy IR/ROXICODONE) immediate release tablet 5 mg, 5 mg, Oral, Q4H PRN, Skeet Simmer, RPH .  pantoprazole (PROTONIX) EC tablet 40 mg, 40 mg, Oral, Daily, Erline Levine, MD, 40 mg at 05/01/19 0811 .  polyethylene glycol (MIRALAX / GLYCOLAX) packet 17 g, 17 g, Oral, Daily PRN, Erline Levine, MD, 17 g at 05/01/19 2671 .  pravastatin (PRAVACHOL) tablet 40 mg, 40 mg, Oral, q1800, Erline Levine, MD, 40 mg at 04/30/19 1803 .  sodium chloride flush (NS) 0.9 % injection 3 mL, 3 mL, Intravenous, Q12H, Erline Levine, MD, 3 mL at 05/01/19 0811 .  sodium chloride flush (NS) 0.9 % injection 3 mL, 3 mL, Intravenous, PRN, Erline Levine, MD .  sodium phosphate (FLEET) 7-19 GM/118ML enema 1 enema, 1 enema, Rectal, Once PRN, Erline Levine, MD .  vitamin B-12 (CYANOCOBALAMIN) tablet 5,000 mcg, 5,000 mcg, Oral, QPM, Erline Levine, MD, 5,000 mcg at 04/30/19 1801 .  zolpidem (AMBIEN) tablet 5 mg, 5 mg, Oral, QHS PRN, Erline Levine, MD  Patients Current Diet:      Diet Order                  Diet - low sodium heart healthy         Diet regular Room service appropriate? Yes with Assist; Fluid consistency: Thin  Diet effective now               Precautions / Restrictions Precautions Precautions: Fall, Back Precaution Booklet Issued: No Precaution Comments: reviewed precautions with pt Spinal Brace: Lumbar corset Restrictions Weight Bearing Restrictions: No   Has the patient had 2 or more falls or a fall with injury in the past year? No  Prior Activity Level Community (5-7x/wk): active, driving prior to surgery  Prior Functional Level Self Care: Did the patient need help bathing, dressing, using the toilet or eating? Independent  Indoor Mobility: Did the patient need assistance with walking from room to room (with or without device)? Independent  Stairs: Did the patient need assistance with internal or external stairs (with or without device)? Independent  Functional Cognition: Did the patient need help planning regular tasks such as shopping or remembering to take medications? Independent  Home Assistive Devices / Equipment Home Assistive Devices/Equipment: Eyeglasses, CPAP Home Equipment: Cane - single point  Prior Device Use: Indicate devices/aids used by the patient prior to current illness, exacerbation or injury? None of the above  Current Functional Level Cognition  Overall Cognitive Status: Within Functional Limits for tasks assessed Orientation Level: Oriented X4    Extremity Assessment (includes Sensation/Coordination)  Upper Extremity Assessment: Overall WFL for tasks assessed  Lower Extremity Assessment: Generalized weakness, RLE deficits/detail, LLE deficits/detail RLE Deficits / Details: knee ext 3-/5 but buckling in [place] RLE Sensation: WNL RLE Coordination: decreased gross motor LLE Deficits / Details: knee ext 3-/5 but buckling in [place] LLE Sensation: WNL LLE Coordination:  decreased gross motor    ADLs  Overall ADL's : Needs assistance/impaired Eating/Feeding: Set up, Sitting Grooming: Min guard, Sitting Upper Body Bathing: Minimal assistance, Sitting Lower Body Bathing: Maximal assistance, Sitting/lateral leans, Bed level Upper Body Dressing : Minimal assistance, Sitting Upper Body Dressing Details (indicate cue type and reason): donning brace Lower Body Dressing: Maximal assistance, Sitting/lateral leans Lower Body Dressing Details (indicate cue  type and reason): AE education to be provided in next sessions Toilet Transfer: (with stedy) Toileting- Clothing Manipulation and Hygiene: Maximal assistance, +2 for physical assistance, +2 for safety/equipment, Sitting/lateral lean, Sit to/from stand Functional mobility during ADLs: Minimal assistance, +2 for physical assistance, +2 for safety/equipment(w/ stedy) General ADL Comments: Pt requiring minA for donning brace and maxA for lB ADL; pt with BLE weakness and inability to perform figure 4 technique.    Mobility  Overal bed mobility: Needs Assistance Bed Mobility: Rolling, Sidelying to Sit Rolling: Min assist Sidelying to sit: Mod assist, HOB elevated Sit to supine: Mod assist General bed mobility comments: Cues for log roll technique; use of rail and assist to elevate trunk to get to EOB. Mild dizziness.     Transfers  Overall transfer level: Needs assistance Equipment used: Rolling walker (2 wheeled) Transfer via Lift Equipment: Stedy Transfers: Sit to/from Stand Sit to Stand: Max assist, +2 physical assistance, From elevated surface General transfer comment: Assist of 2 to stand from EOB with cues for hand placement/technique; Heavy use of UEs, bil knee instability noted. Transferred to chair.    Ambulation / Gait / Stairs / Wheelchair Mobility  Ambulation/Gait Ambulation/Gait assistance: Mod assist, +2 safety/equipment Gait Distance (Feet): 4 Feet Assistive device: Rolling walker (2  wheeled) Gait Pattern/deviations: Trunk flexed, Step-to pattern General Gait Details: Able to take a few steps to get to chair with assist for balance; bil knee instability noted. Buckling started towards end when fatigued. Heavy reliance on UEs.     Posture / Balance Dynamic Sitting Balance Sitting balance - Comments: Assist to donn brace.  Balance Overall balance assessment: Needs assistance Sitting-balance support: Feet supported, No upper extremity supported Sitting balance-Leahy Scale: Fair Sitting balance - Comments: Assist to donn brace.  Standing balance support: Bilateral upper extremity supported, During functional activity Standing balance-Leahy Scale: Poor Standing balance comment: Reliant on BUEs and external support in standing. Bil knee instability noted. marching in standing ~20 seconds.     Special needs/care consideration BiPAP/CPAP wears CPAP at night at home, but has not worn it in the hospital CPM no Continuous Drip IV no Dialysis no        Days n/a Life Vest no Oxygen no Special Bed no Trach Size no Wound Vac (area) no      Location n/a Skin surgical incision to back                             Bowel mgmt: continent, last BM 04/30/2019 Bladder mgmt: continent Diabetic mgmt: no Behavioral consideration no Chemo/radiation no   Previous Home Environment (from acute therapy documentation) Living Arrangements: Spouse/significant other Available Help at Discharge: Family, Available 24 hours/day Type of Home: House Home Layout: One level Home Access: Stairs to enter Entrance Stairs-Rails: None Entrance Stairs-Number of Steps: 1 Bathroom Shower/Tub: Multimedia programmer: Handicapped height Home Care Services: No  Discharge Living Setting Plans for Discharge Living Setting: Patient's home Type of Home at Discharge: House Discharge Home Layout: One level Discharge Home Access: Stairs to enter Entrance Stairs-Rails: None Entrance Stairs-Number  of Steps: 1 Discharge Bathroom Shower/Tub: Walk-in shower Discharge Bathroom Toilet: Standard Discharge Bathroom Accessibility: Yes How Accessible: Accessible via walker Does the patient have any problems obtaining your medications?: No  Social/Family/Support Systems Patient Roles: Spouse Anticipated Caregiver: pt's spouse, Juliann Pulse Anticipated Ambulance person Information: [phone] Ability/Limitations of Caregiver: none Caregiver Availability: 24/7 Discharge Plan Discussed with Primary Caregiver: Yes Is Caregiver  In Agreement with Plan?: Yes Does Caregiver/Family have Issues with Lodging/Transportation while Pt is in Rehab?: No  Goals/Additional Needs Patient/Family Goal for Rehab: PT/OT mod I w/c level, supervision ambulatory Expected length of stay: 14-18 days Dietary Needs: reg/thin Pt/Family Agrees to Admission and willing to participate: Yes Program Orientation Provided & Reviewed with Pt/Caregiver Including Roles  & Responsibilities: Yes   Possible need for SNF placement upon discharge: not anticipated  Patient Condition: I have reviewed medical records from [facility], spoken with CM, and patient and spouse. I met with patient at the bedside and discussed with wife via phone for inpatient rehabilitation assessment.  Patient will benefit from ongoing PT and OT, can actively participate in 3 hours of therapy a day 5 days of the week, and can make measurable gains during the admission.  Patient will also benefit from the coordinated team approach during an Inpatient Acute Rehabilitation admission.  The patient will receive intensive therapy as well as Rehabilitation physician, nursing, social worker, and care management interventions.  Due to safety, skin/wound care, medication administration, pain management and patient education the patient requires 24 hour a day rehabilitation nursing.  The patient is currently mod/max with mobility and basic ADLs.  Discharge  setting and therapy post discharge at home with home health is anticipated.  Patient has agreed to participate in the Acute Inpatient Rehabilitation Program and will admit today.  Preadmission Screen Completed By:  Michel Santee, PT, DPT 05/01/2019 2:08 PM ______________________________________________________________________   Discussed status with Dr. Naaman Plummer on 05/01/19  at 2:08 PM  and received approval for admission today.  Admission Coordinator:  Michel Santee, PT, DPT time 2:08 PM Sudie Grumbling 05/01/19    Assessment/Plan: Diagnosis: lumbar stenosis with radiculopathy 1. Does the need for close, 24 hr/day Medical supervision in concert with the patient's rehab needs make it unreasonable for this patient to be served in a less intensive setting? Yes 2. Co-Morbidities requiring supervision/potential complications: COPD, HTN 3. Due to bladder management, bowel management, safety, skin/wound care, disease management, medication administration, pain management and patient education, does the patient require 24 hr/day rehab nursing? Yes 4. Does the patient require coordinated care of a physician, rehab nurse, PT (1-2 hrs/day, 5 days/week) and OT (1-2 hrs/day, 5 days/week) to address physical and functional deficits in the context of the above medical diagnosis(es)? Yes Addressing deficits in the following areas: balance, endurance, locomotion, strength, transferring, bowel/bladder control, bathing, dressing, feeding, grooming, toileting and psychosocial support 5. Can the patient actively participate in an intensive therapy program of at least 3 hrs of therapy 5 days a week? Yes 6. The potential for patient to make measurable gains while on inpatient rehab is excellent 7. Anticipated functional outcomes upon discharge from inpatients are: modified independent PT, modified independent OT, n/a SLP 8. Estimated rehab length of stay to reach the above functional goals is: 14-18 days 9. Anticipated D/C  setting: Home 10. Anticipated post D/C treatments: [place] therapy 11. Overall Rehab/Functional Prognosis: excellent  MD Signature: Meredith Staggers, MD, [facility] 05/01/2019         Revision History

## 2019-05-01 NOTE — Progress Notes (Signed)
Patient arrived to the floor at 1730, VSS and in no apparent distress.

## 2019-05-01 NOTE — Plan of Care (Signed)

## 2019-05-01 NOTE — H&P (Signed)
Physical Medicine and Rehabilitation Admission H&P     Chief complaint: Back pain HPI: Patrick Waters is a 68 year old right-handed male history of primary immunodeficiency receives Hyqvia 50 g every 28 days COPD, prior lumbar and cervical back surgeries.  Per chart review patient lives with spouse.  He is retired independent driving.  One level home with one-step to entry.  Presented 04/27/2019 with persistent low back pain radiating to the lower extremities.  X-rays and imaging revealed lumbar foraminal stenosis, spondylolisthesis, herniated disc, scoliosis with lumbar radiculopathy L2-3, 3-4 and 4-5.  Underwent lumbar 2-3, 3-4 and 4-5 transforaminal lumbar interbody fusion with redo decompression, pedicle screw fixation 04/27/2019 per Dr. Vertell Limber.  Hospital course pain management.  Lumbar corset when out of bed.  Tolerating a regular diet.  Bouts of constipation with bowel program adjusted.  Therapy evaluations completed and patient was admitted for a comprehensive rehab program.   Review of Systems  Constitutional: Negative for chills and fever.  HENT: Negative for hearing loss.   Eyes: Negative for double vision.  Respiratory: Positive for shortness of breath. Negative for cough.   Cardiovascular: Negative for chest pain, palpitations and leg swelling.  Gastrointestinal: Positive for constipation. Negative for heartburn, nausea and vomiting.       GERD  Genitourinary: Negative for dysuria and hematuria.  Musculoskeletal: Positive for back pain and myalgias.  All other systems reviewed and are negative.       Past Medical History:  Diagnosis Date  . Arthritis    . Asthma    . COPD (chronic obstructive pulmonary disease) ([place])    . Dyspnea      with exertion  . Family history of adverse reaction to anesthesia      Daughter- N/V  . GERD (gastroesophageal reflux disease)    . Hypertension    . Pneumonia    . Sleep apnea      uses cpap         Past Surgical History:  Procedure  Laterality Date  . ANAL FISSURE REPAIR      . BACK SURGERY        lower back  . cervial fusion        x 2  . COLONOSCOPY      . Hemmorhoid      . POSTERIOR CERVICAL FUSION/FORAMINOTOMY N/A 02/14/2018    Procedure: Cervical six-Thoracic one Posterior decompression and fusion;  Surgeon: Erline Levine, MD;  Location: [facility];  Service: Neurosurgery;  Laterality: N/A;  . TRANSFORAMINAL LUMBAR INTERBODY FUSION (TLIF) WITH PEDICLE SCREW FIXATION 3 LEVEL Left 04/27/2019    Procedure: Left Lumbar two-three Lumbar three-four Lumbar four-five Transforaminal lumbar interbody fusion with redo decompression, pedicle screw fixation Lumbar two to Lumbar five;  Surgeon: Erline Levine, MD;  Location: [facility];  Service: Neurosurgery;  Laterality: Left;  Left Lumbar two-three Lumbar three-four Lumbar four-five Transforaminal lumbar interbody fusion with redo decompression, pedicle         Family History  Problem Relation Age of Onset  . COPD Father    . Diabetes Brother      Social History:  reports that he quit smoking about 33 years ago. He quit after 20.00 years of use. He has quit using smokeless tobacco. He reports previous alcohol use. He reports previous drug use. Allergies:      Allergies  Allergen Reactions  . Hydrochlorothiazide Hives and Rash  . Meperidine Nausea And Vomiting          Medications  Prior to Admission  Medication Sig Dispense Refill  . albuterol (PROVENTIL HFA;VENTOLIN HFA) 108 (90 Base) MCG/ACT inhaler Inhale 2 puffs into the lungs every 6 (six) hours as needed for wheezing or shortness of breath.      Marland Kitchen amLODipine (NORVASC) 10 MG tablet Take 10 mg by mouth daily.       Marland Kitchen azelastine (ASTELIN) 0.1 % nasal spray Place 2 sprays into both nostrils 2 (two) times daily.       . budesonide-formoterol (SYMBICORT) 160-4.5 MCG/ACT inhaler Inhale 2 puffs into the lungs 2 (two) times daily.      . Calcium Carb-Cholecalciferol (CALCIUM + D3 PO) Take 1 tablet by mouth daily with breakfast.       . Cholecalciferol (VITAMIN D3) 5000 units CAPS Take 5,000 Units by mouth every evening.       . Coenzyme Q10 (CO Q 10) 100 MG CAPS Take 100 mg by mouth daily.      . Cyanocobalamin (VITAMIN B-12) 5000 MCG TBDP Take 5,000 mcg by mouth every evening.       . fluticasone (FLONASE) 50 MCG/ACT nasal spray Place 1 spray into both nostrils 2 (two) times daily.      Marland Kitchen guaiFENesin (MUCINEX) 600 MG 12 hr tablet Take 600 mg by mouth 2 (two) times daily.      . Immune Globulin-Hyaluronidase (HYQVIA) 10 GM/100ML KIT 50 g by Subcutaneous Infusion route every 28 (twenty-eight) days.      Marland Kitchen ipratropium-albuterol (DUONEB) 0.5-2.5 (3) MG/3ML SOLN Take 3 mLs by nebulization See admin instructions. Nebulize 3 ml's two to four times a day      . levocetirizine (XYZAL) 5 MG tablet Take 5 mg by mouth every evening.      Marland Kitchen lisinopril (PRINIVIL,ZESTRIL) 20 MG tablet Take 20 mg by mouth daily.      . meloxicam (MOBIC) 7.5 MG tablet Take 7.5 mg by mouth every evening.      . montelukast (SINGULAIR) 10 MG tablet Take 10 mg by mouth at bedtime.       . Multiple Vitamins-Minerals (MULTIVITAMIN PO) Take 1 tablet by mouth daily.      . Omalizumab (XOLAIR) 75 MG/0.5ML SOSY Inject 375 mg into the skin every 14 (fourteen) days.      Marland Kitchen omeprazole (PRILOSEC) 20 MG capsule Take 20 mg by mouth every evening.      Marland Kitchen oxyCODONE-acetaminophen (PERCOCET) 10-325 MG tablet Take 1 tablet by mouth every 6 (six) hours as needed for pain.      . pravastatin (PRAVACHOL) 40 MG tablet Take 40 mg by mouth at bedtime.       . Probiotic CAPS Take 1 capsule by mouth daily with breakfast.       . diphenhydrAMINE (BENADRYL) 25 mg capsule Take 25 mg by mouth every 6 (six) hours as needed for allergies.          Drug Regimen Review Drug regimen was reviewed and remains appropriate with no significant issues identified   Home: Home Living Family/patient expects to be discharged to:: Private residence Living Arrangements: Spouse/significant other  Available Help at Discharge: Family, Available 24 hours/day Type of Home: House Home Access: Stairs to enter [employer] of Steps: 1 Entrance Stairs-Rails: None Home Layout: One level Bathroom Shower/Tub: Multimedia programmer: Handicapped height Home Equipment: [place] - single point   Functional History: Prior Function Level of Independence: Independent Comments: retired and independent w/ driving   Functional Status:  Mobility: Bed Mobility Overal bed mobility: Needs Assistance  Bed Mobility: Rolling, Sidelying to Sit Rolling: Min assist Sidelying to sit: Mod assist, HOB elevated Sit to supine: Mod assist General bed mobility comments: Cues for log roll technique; use of rail and assist to elevate trunk to get to EOB. Mild dizziness.  Transfers Overall transfer level: Needs assistance Equipment used: Rolling walker (2 wheeled) Transfer via Lift Equipment: Stedy Transfers: Sit to/from Stand Sit to Stand: Max assist, +2 physical assistance, From elevated surface General transfer comment: Assist of 2 to stand from EOB with cues for hand placement/technique; Heavy use of UEs, bil knee instability noted. Transferred to chair. Ambulation/Gait Ambulation/Gait assistance: Mod assist, +2 safety/equipment Gait Distance (Feet): 4 Feet Assistive device: Rolling walker (2 wheeled) Gait Pattern/deviations: Trunk flexed, Step-to pattern General Gait Details: Able to take a few steps to get to chair with assist for balance; bil knee instability noted. Buckling started towards end when fatigued. Heavy reliance on UEs.    ADL: ADL Overall ADL's : Needs assistance/impaired Eating/Feeding: Set up, Sitting Grooming: Min guard, Sitting Upper Body Bathing: Minimal assistance, Sitting Lower Body Bathing: Maximal assistance, Sitting/lateral leans, Bed level Upper Body Dressing : Minimal assistance, Sitting Upper Body Dressing Details (indicate cue type and reason): donning  brace Lower Body Dressing: Maximal assistance, Sitting/lateral leans Lower Body Dressing Details (indicate cue type and reason): AE education to be provided in next sessions Toilet Transfer: (with stedy) Toileting- Clothing Manipulation and Hygiene: Maximal assistance, +2 for physical assistance, +2 for safety/equipment, Sitting/lateral lean, Sit to/from stand Functional mobility during ADLs: Minimal assistance, +2 for physical assistance, +2 for safety/equipment(w/ stedy) General ADL Comments: Pt requiring minA for donning brace and maxA for lB ADL; pt with BLE weakness and inability to perform figure 4 technique.   Cognition: Cognition Overall Cognitive Status: Within Functional Limits for tasks assessed Orientation Level: Oriented X4 Cognition Arousal/Alertness: Awake/alert Behavior During Therapy: WFL for tasks assessed/performed Overall Cognitive Status: Within Functional Limits for tasks assessed   Physical Exam: Blood pressure 134/82, pulse 73, temperature (!) 97.5 F (36.4 C), temperature source Oral, resp. rate 18, height '5\' 10"'  (1.778 m), weight 104.3 kg, SpO2 96 %. Physical Exam  Constitutional: He is oriented to person, place, and time. He appears well-developed and well-nourished.  HENT:  Head: Normocephalic.  Eyes: Pupils are equal, round, and reactive to light.  Neck: Normal range of motion.  Cardiovascular: Normal rate.  Murmur heard. Respiratory: Effort normal. No respiratory distress. He has no wheezes.  GI: Soft. He exhibits no distension. There is no abdominal tenderness.  Musculoskeletal:        General: Edema: LE edema.  Neurological: He is alert and oriented to person, place, and time. No cranial nerve deficit.  Patient is alert in no acute distress.  Follows full commands.UE 4/5. LE 3/5 HF, KE and 4-/5 ADF/PF. No focal sensory changes.   Skin: Skin is warm.  Back incision is dressed  Psychiatric: He has a normal mood and affect. His behavior is normal.       Lab Results Last 48 Hours  No results found for this or any previous visit (from the past 48 hour(s)).   Imaging Results (Last 48 hours)  No results found.           Medical Problem List and Plan: 1.  Decreased functional mobility secondary to lumbar stenosis, spondylolisthesis with radiculopathy.  Status post L2-3, 3-4 and 4-5 lumbar interbody fusion with pedicle screw fixation 04/27/2019.  Lumbar corset when out of bed             -  admit to inpatient rehab 2.  Antithrombotics: -DVT/anticoagulation: SCDs.  Check vascular study             -antiplatelet therapy: N/A 3. Pain Management: Neurontin 300 mg 3 times daily, oxycodone and Robaxin as needed 4. Mood: Provide emotional support             -antipsychotic agents: N/A 5. Neuropsych: This patient is capable of making decisions on his own behalf. 6. Skin/Wound Care: Routine skin checks 7. Fluids/Electrolytes/Nutrition: Routine in and outs with follow-up chemistries 8.  Hypertension.  Norvasc 10 mg daily, lisinopril 20 mg daily.  Monitor with increased mobility 9.  COPD/asthma.  Continue nebulizers as directed 10.  Primary immunodeficiency.  Patient receives HYQVIA 50 g every 28 days with next scheduled dose 05/04/2019.  Wife is to bring in supply. Infusion process to be determined. (wife vs qualified nurse) 22.  GERD.  Protonix 12.  Hyperlipidemia.  Pravachol 13.  Constipation.  Laxative assistance      Post Admission Physician Evaluation: 1. Functional deficits secondary  to lumbar stenosis/radic. 2. Patient is admitted to receive collaborative, interdisciplinary care between the physiatrist, rehab nursing staff, and therapy team. 3. Patient's level of medical complexity and substantial therapy needs in context of that medical necessity cannot be provided at a lesser intensity of care such as a SNF. 4. Patient has experienced substantial functional loss from his/her baseline which was documented above under the "Functional  History" and "Functional Status" headings.  Judging by the patient's diagnosis, physical exam, and functional history, the patient has potential for functional progress which will result in measurable gains while on inpatient rehab.  These gains will be of substantial and practical use upon discharge  in facilitating mobility and self-care at the household level. 5. Physiatrist will provide 24 hour management of medical needs as well as oversight of the therapy plan/treatment and provide guidance as appropriate regarding the interaction of the two. 6. The Preadmission Screening has been reviewed and patient status is unchanged unless otherwise stated above. 7. 24 hour rehab nursing will assist with bladder management, bowel management, safety, skin/wound care, disease management, medication administration, pain management and patient education  and help integrate therapy concepts, techniques,education, etc. 8. PT will assess and treat for/with: Lower extremity strength, range of motion, stamina, balance, functional mobility, safety, adaptive techniques and equipment, NMR.   Goals are: mod I w/c, supervision ambulation. 9. OT will assess and treat for/with: ADL's, functional mobility, safety, upper extremity strength, adaptive techniques and equipment .   Goals are: mod I at w/c level. Therapy may proceed with showering this patient. 10. SLP will assess and treat for/with: n/a.  Goals are: n/a. 11. Case Management and Social Worker will assess and treat for psychological issues and discharge planning. 12. Team conference will be held weekly to assess progress toward goals and to determine barriers to discharge. 13. Patient will receive at least 3 hours of therapy per day at least 5 days per week. 14. ELOS: 14-18 days       15. Prognosis:  excellent   I have personally performed a face to face diagnostic evaluation of this patient and formulated the key components of the plan.  Additionally, I have  personally reviewed laboratory data, imaging studies, as well as relevant notes and concur with the physician assistant's documentation above.  Meredith Staggers, MD, FAAPMR       Lavon Paganini Roslyn, PA-C 05/01/2019

## 2019-05-01 NOTE — Progress Notes (Addendum)
Subjective: Patient reports "I'm feeling better; still not much of a bowel movement"  Objective: Vital signs in last 24 hours: Temp:  [97.5 F (36.4 C)-99.2 F (37.3 C)] 97.5 F (36.4 C) (06/09 0408) Pulse Rate:  [73-95] 73 (06/09 0408) Resp:  [18-19] 18 (06/09 0408) BP: (115-156)/(72-85) 134/82 (06/09 0408) SpO2:  [96 %-98 %] 96 % (06/09 0408)  Intake/Output from previous day: 06/08 0701 - 06/09 0700 In: -  Out: 700 [Urine:700] Intake/Output this shift: No intake/output data recorded.  Alert, conversant. MAEW, good strength BLE. Incisdion flat without erythema or drainage beneath honeycomb and Dermabond. Hopeful of CIR today.  Lab Results: No results for input(s): WBC, HGB, HCT, PLT in the last 72 hours. BMET No results for input(s): NA, K, CL, CO2, GLUCOSE, BUN, CREATININE, CALCIUM in the last 72 hours.  Studies/Results: No results found.  Assessment/Plan: Improving  LOS: 4 days  Per DrStern, d/c to CIR when bed available.    Patrick Waters 05/01/2019, 7:24 AM   Patient progressing well.  Transfer to CIR when bed available, hopefully today.

## 2019-05-01 NOTE — Progress Notes (Signed)
Inpatient Rehab Admissions Coordinator:   I have approval from pt's attending for pt to admit to CIR today. I will let pt/family, and RN/CM know.   Shann Medal, PT, DPT Admissions Coordinator [phone] 05/01/19  2:06 PM

## 2019-05-02 ENCOUNTER — Inpatient Hospital Stay (HOSPITAL_COMMUNITY): Payer: Medicare Other | Admitting: Occupational Therapy

## 2019-05-02 ENCOUNTER — Inpatient Hospital Stay (HOSPITAL_COMMUNITY): Payer: Medicare Other | Admitting: Physical Therapy

## 2019-05-02 DIAGNOSIS — D72823 Leukemoid reaction: Secondary | ICD-10-CM

## 2019-05-02 LAB — CBC WITH DIFFERENTIAL/PLATELET
Abs Immature Granulocytes: 0.09 10*3/uL — ABNORMAL HIGH (ref 0.00–0.07)
Basophils Absolute: 0 10*3/uL (ref 0.0–0.1)
Basophils Relative: 0 %
Eosinophils Absolute: 0.1 10*3/uL (ref 0.0–0.5)
Eosinophils Relative: 0 %
HCT: 35.9 % — ABNORMAL LOW (ref 39.0–52.0)
Hemoglobin: 12.6 g/dL — ABNORMAL LOW (ref 13.0–17.0)
Immature Granulocytes: 1 %
Lymphocytes Relative: 14 %
Lymphs Abs: 1.7 10*3/uL (ref 0.7–4.0)
MCH: 31.1 pg (ref 26.0–34.0)
MCHC: 35.1 g/dL (ref 30.0–36.0)
MCV: 88.6 fL (ref 80.0–100.0)
Monocytes Absolute: 1.2 10*3/uL — ABNORMAL HIGH (ref 0.1–1.0)
Monocytes Relative: 10 %
Neutro Abs: 9.3 10*3/uL — ABNORMAL HIGH (ref 1.7–7.7)
Neutrophils Relative %: 75 %
Platelets: 213 10*3/uL (ref 150–400)
RBC: 4.05 MIL/uL — ABNORMAL LOW (ref 4.22–5.81)
RDW: 12.6 % (ref 11.5–15.5)
WBC: 12.4 10*3/uL — ABNORMAL HIGH (ref 4.0–10.5)
nRBC: 0 % (ref 0.0–0.2)

## 2019-05-02 LAB — COMPREHENSIVE METABOLIC PANEL
ALT: 23 U/L (ref 0–44)
AST: 27 U/L (ref 15–41)
Albumin: 2.4 g/dL — ABNORMAL LOW (ref 3.5–5.0)
Alkaline Phosphatase: 66 U/L (ref 38–126)
Anion gap: 10 (ref 5–15)
BUN: 16 mg/dL (ref 8–23)
CO2: 24 mmol/L (ref 22–32)
Calcium: 8.1 mg/dL — ABNORMAL LOW (ref 8.9–10.3)
Chloride: 96 mmol/L — ABNORMAL LOW (ref 98–111)
Creatinine, Ser: 0.78 mg/dL (ref 0.61–1.24)
GFR calc Af Amer: >60 mL/min (ref >60)
GFR calc non Af Amer: >60 mL/min (ref >60)
Glucose, Bld: 117 mg/dL — ABNORMAL HIGH (ref 70–99)
Potassium: 3.9 mmol/L (ref 3.5–5.1)
Sodium: 130 mmol/L — ABNORMAL LOW (ref 135–145)
Total Bilirubin: 1 mg/dL (ref 0.3–1.2)
Total Protein: 5.5 g/dL — ABNORMAL LOW (ref 6.5–8.1)

## 2019-05-02 MED ORDER — SORBITOL 70 % SOLN
60.0000 mL | Status: AC
  Administered 2019-05-02: 60 mL via ORAL
  Filled 2019-05-02: qty 60

## 2019-05-02 MED ORDER — IPRATROPIUM-ALBUTEROL 0.5-2.5 (3) MG/3ML IN SOLN
3.0000 mL | Freq: Three times a day (TID) | RESPIRATORY_TRACT | Status: DC
  Administered 2019-05-02 – 2019-05-10 (×23): 3 mL via RESPIRATORY_TRACT
  Filled 2019-05-02 (×24): qty 3

## 2019-05-02 NOTE — Progress Notes (Signed)
Patient information reviewed and entered into [facility] System by Becky Ouita Nish, PPS coordinator. Information including medical coding, function ability, and quality indicators will be reviewed and updated through discharge.   

## 2019-05-02 NOTE — Progress Notes (Signed)
[facility] PHYSICAL MEDICINE & REHABILITATION PROGRESS NOTE   Subjective/Complaints: Fair night. C/o constipation. Only 2 small "blobs" while on acute. Back still sore  ROS: Patient denies fever, rash, sore throat, blurred vision, nausea, vomiting, diarrhea, cough, shortness of breath or chest pain,  headache, or mood change.    Objective:   No results found. Recent Labs    05/02/19 0610  WBC 12.4*  HGB 12.6*  HCT 35.9*  PLT 213   Recent Labs    05/02/19 0610  NA 130*  K 3.9  CL 96*  CO2 24  GLUCOSE 117*  BUN 16  CREATININE 0.78  CALCIUM 8.1*    Intake/Output Summary (Last 24 hours) at 05/02/2019 0853 Last data filed at 05/02/2019 0740 Gross per 24 hour  Intake 240 ml  Output 550 ml  Net -310 ml     Physical Exam: Vital Signs Blood pressure 122/73, pulse 79, temperature 98.5 F (36.9 C), temperature source Oral, resp. rate 16, height 5\' 10"  (1.778 m), weight 105.5 kg, SpO2 95 %. Constitutional: No distress . Vital signs reviewed. HEENT: EOMI, oral membranes moist Neck: supple Cardiovascular: RRR without murmur. No JVD    Respiratory: CTA Bilaterally without wheezes or rales. Normal effort    GI: BS high pitched, abdomen distended Musculoskeletal:  General: Edema: LE edema. Neurological: He is alertand oriented to person, place, and time. Nocranial nerve deficit. Patient is alert in no acute distress. Follows full commands.UE 4/5. LE 3/5 HF, KE and 4-/5 ADF/PF. No focal sensory changes. --stable neuro Skin: Skin iswarm. Back incision CDI with honeycomb dressing, skin moist around area from sweat Psychiatric: a little anxious.     Assessment/Plan: 1. Functional deficits secondary to lumbar stenosis with radiculopathy which require 3+ hours per day of interdisciplinary therapy in a comprehensive inpatient rehab setting.  Physiatrist is providing close team supervision and 24 hour management of active medical problems listed  below.  Physiatrist and rehab team continue to assess barriers to discharge/monitor patient progress toward functional and medical goals  Care Tool:  Bathing              Bathing assist       Upper Body Dressing/Undressing Upper body dressing        Upper body assist      Lower Body Dressing/Undressing Lower body dressing            Lower body assist       Toileting Toileting    Toileting assist       Transfers Chair/bed transfer  Transfers assist           Locomotion Ambulation   Ambulation assist              Walk 10 feet activity   Assist           Walk 50 feet activity   Assist           Walk 150 feet activity   Assist           Walk 10 feet on uneven surface  activity   Assist           Wheelchair     Assist               Wheelchair 50 feet with 2 turns activity    Assist            Wheelchair 150 feet activity     Assist  Medical Problem List and Plan: 1.Decreased functional mobilitysecondary to lumbar stenosis, spondylolisthesis with radiculopathy. Status post L2-3, 3-4 and 4-5 lumbar interbody fusion with pedicle screw fixation 04/27/2019. Lumbar corset when out of bed --beginning therapies toeay 2. Antithrombotics: -DVT/anticoagulation:SCDs. Check vascular study today -antiplatelet therapy: N/A 3. Pain Management:Neurontin 300 mg 3 times daily,oxycodoneand Robaxin as needed 4. Mood:Provide emotional support -antipsychotic agents: N/A 5. Neuropsych: This patientiscapable of making decisions on hisown behalf. 6. Skin/Wound Care:Routine skin checks 7. Fluids/Electrolytes/Nutrition:encourage PO  -I personally reviewed the patient's labs today.   8.Hypertension. Norvasc 10 mg daily, lisinopril 20 mg daily. Monitor with increased mobility  -controlled 9. COPD/asthma. Continue nebulizers as  directed 10.Primary immunodeficiency. Patient receives HYQVIA50 g every 28 days with next scheduled dose 05/04/2019. Wife is to bring in supply. Infusion process to be determined. (wife vs qualified nurse)---follow up with nursing team 11. GERD. Protonix 12. Hyperlipidemia. Pravachol 13. Constipation. distended  -sorbitol today, SSE if needed 14. Leukocytosis: likely reactive  -monitor for now, no signs of infection at present    LOS: 1 days A FACE TO FACE EVALUATION WAS PERFORMED  Ranelle OysterZachary T Ralph Benavidez 05/02/2019, 8:53 AM

## 2019-05-02 NOTE — Evaluation (Signed)
Physical Therapy Assessment and Plan  Patient Details  Name: Patrick Waters MRN: [id number] Date of Birth: 1951-08-11  PT Diagnosis: Abnormal posture, Abnormality of gait, Coordination disorder, Difficulty walking, Low back pain, Muscle weakness and Pain in back/legs Rehab Potential: Good ELOS: 2-2.5 weeks   Today's Date: 05/02/2019 PT Individual Time: 6384-6659 PT Individual Time Calculation (min): 24 min    Problem List:  Patient Active Problem List   Diagnosis Date Noted  . Lumbar radiculopathy 05/01/2019  . Idiopathic scoliosis of thoracolumbar region 04/27/2019  . Spondylolisthesis of cervical region 02/14/2018    Past Medical History:  Past Medical History:  Diagnosis Date  . Arthritis   . Asthma   . COPD (chronic obstructive pulmonary disease) ([place])   . Dyspnea    with exertion  . Family history of adverse reaction to anesthesia    Daughter- N/V  . GERD (gastroesophageal reflux disease)   . Hypertension   . Pneumonia   . Sleep apnea    uses cpap   Past Surgical History:  Past Surgical History:  Procedure Laterality Date  . ANAL FISSURE REPAIR    . BACK SURGERY     lower back  . cervial fusion     x 2  . COLONOSCOPY    . Hemmorhoid    . POSTERIOR CERVICAL FUSION/FORAMINOTOMY N/A 02/14/2018   Procedure: Cervical six-Thoracic one Posterior decompression and fusion;  Surgeon: Erline Levine, MD;  Location: [place];  Service: Neurosurgery;  Laterality: N/A;  . TRANSFORAMINAL LUMBAR INTERBODY FUSION (TLIF) WITH PEDICLE SCREW FIXATION 3 LEVEL Left 04/27/2019   Procedure: Left Lumbar two-three Lumbar three-four Lumbar four-five Transforaminal lumbar interbody fusion with redo decompression, pedicle screw fixation Lumbar two to Lumbar five;  Surgeon: Erline Levine, MD;  Location: [place];  Service: Neurosurgery;  Laterality: Left;  Left Lumbar two-three Lumbar three-four Lumbar four-five Transforaminal lumbar interbody fusion with redo decompression, pedicle    Assessment &  Plan Clinical Impression: Patient is a 68 y.o. year old male with history ofprimary immunodeficiency receives Hyqvia50 g every 28 daysCOPD, prior lumbar and cervical back surgeries. Per chart review patient lives with spouse. He is retired independent driving. One level home with one-step to entry. Presented 04/27/2019 with persistent low back pain radiating to the lower extremities. X-rays and imaging revealed lumbar foraminal stenosis, spondylolisthesis, herniated disc, scoliosis with lumbar radiculopathy L2-3, 3-4 and 4-5. Underwent lumbar 2-3, 3-4 and 4-5 transforaminal lumbar interbody fusion with redo decompression, pedicle screw fixation 04/27/2019 per Dr. Vertell Limber. Hospital course pain management. Lumbar corset when out of bed. Tolerating a regular diet. Bouts of constipation with bowel program adjusted. Therapy evaluations completed and patient was admitted for a comprehensive rehab program..  Patient transferred to CIR on 05/01/2019 .   Patient currently requires mod with mobility secondary to muscle weakness, decreased cardiorespiratoy endurance, decreased coordination, and decreased standing balance, decreased postural control, decreased balance strategies and difficulty maintaining precautions.  Prior to hospitalization, patient was independent  with mobility and lived with Spouse(Patrick Waters) in a House home.  Home access is 1 step + small threshold stepStairs to enter.  Patient will benefit from skilled PT intervention to maximize safe functional mobility, minimize fall risk and decrease caregiver burden for planned discharge home with 24 hour assist.  Anticipate patient will benefit from follow up [facility] at discharge.  PT - End of Session Activity Tolerance: Tolerates 10 - 20 min activity with multiple rests;Decreased this session Endurance Deficit: Yes Endurance Deficit Description: 2/2 generalized deconditioning & fatigue PT  Assessment Rehab Potential (ACUTE/IP ONLY): Good PT Patient  demonstrates impairments in the following area(s): Balance;Pain;Endurance;Safety;Motor;Sensory PT Transfers Functional Problem(s): Bed Mobility;Car;Furniture;Bed to Chair PT Locomotion Functional Problem(s): Ambulation;Wheelchair Mobility;Stairs PT Plan PT Intensity: Minimum of 1-2 x/day ,45 to 90 minutes PT Frequency: 5 out of 7 days PT Duration Estimated Length of Stay: 2-2.5 weeks PT Treatment/Interventions: Ambulation/gait training;Discharge planning;DME/adaptive equipment instruction;Functional mobility training;Pain management;Psychosocial support;Splinting/orthotics;Therapeutic Activities;UE/LE Strength taining/ROM;Wheelchair propulsion/positioning;UE/LE Coordination activities;Therapeutic Exercise;Stair training;Skin care/wound management;Neuromuscular re-education;Patient/family education;Functional electrical stimulation;Disease management/prevention;Community reintegration;Balance/vestibular training PT Transfers Anticipated Outcome(s): supervision with LRAD PT Locomotion Anticipated Outcome(s): supervision gait with LRAD except min assist stairs without rails with LRAD PT Recommendation Recommendations for Other Services: Therapeutic Recreation consult Therapeutic Recreation Interventions: Stress management Follow Up Recommendations: Home health PT;24 hour supervision/assistance Patient destination: Home Equipment Recommended: To be determined  Skilled Therapeutic Intervention Patient received in w/c reporting fatigue & discomfort sitting in w/c as well as 5-7/10 back pain but RN reports he is premedicated. PT evaluation initiated with therapist educating pt on weekly team meetings, daily therapy schedule, safety plan in room/need to use call bell & reviewed PLOF & home information with pt. Provided pt with 20x16 w/c for increased comfort & to promote OOB tolerance and pt transfers sit>stand and stand pivot with mod assist & RW with reliance on BUE & notable BLE weakness. Therapist  provides cuing for technique, sequencing & safety with pt demonstrating poor eccentric control with stand>sit. Pt reports nausea & lightheadedness & requests to lie down. Pt transfers to bed with stedy lift and sit>supine with mod assist to transfer BLE onto bed and cuing for log rolling. Reviewed back precautions with pt. Pt declined participating in leg exercises and requests to rest. Pt left in bed with alarm set & needs at hand.   PT Evaluation Precautions/Restrictions Precautions Precautions: Fall;Back Required Braces or Orthoses: Spinal Brace Spinal Brace: Lumbar corset;Applied in sitting position Restrictions Weight Bearing Restrictions: No  General Chart Reviewed: Yes Additional Pertinent History: COPD, hx of lumbar/cervical surgery, arthritis, asthma, dyspnea, HTN PT Amount of Missed Time (min): 51 Minutes PT Missed Treatment Reason: Patient ill (Comment);Patient fatigue(nausea) Response to Previous Treatment: (pt with significant c/o fatigue) Family/Caregiver Present: No  Home Living/Prior Functioning Home Living Available Help at Discharge: Family;Available 24 hours/day Type of Home: House Home Access: Stairs to enter [employer] of Steps: 1 step + small threshold step Entrance Stairs-Rails: None Home Layout: One level  Lives With: Spouse(Patrick Waters) Prior Function Level of Independence: Independent with basic ADLs;Independent with homemaking with ambulation;Independent with gait;Independent with transfers  Able to Take Stairs?: Yes Driving: Yes("some" per pt) Vocation: Retired Comments: has 2 dogs ([place], [place])  Vision/Perception  Pt wears glasses at all times at baseline. No apparent changes in baseline vision. Perception WNL.  Cognition Overall Cognitive Status: Within Functional Limits for tasks assessed Arousal/Alertness: Awake/alert Orientation Level: Oriented X4  Sensation Sensation Light Touch: Not tested Proprioception: Not  tested Coordination Gross Motor Movements are Fluid and Coordinated: No(BLE weakness)  Motor  Motor Motor: Abnormal postural alignment and control Motor - Skilled Clinical Observations: generalized deconditioning   Mobility Bed Mobility Bed Mobility: Sit to Sidelying Left;Rolling Right(bed rails, HOB elevated) Rolling Right: Supervision/verbal cueing Sit to Sidelying Left: Moderate Assistance - Patient 50-74% Transfers Transfers: Sit to Stand;Stand to Sit;Stand Pivot Transfers Sit to Stand: Moderate Assistance - Patient 50-74% Stand to Sit: Moderate Assistance - Patient 50-74% Stand Pivot Transfers: Moderate Assistance - Patient 50 - 74% Stand Pivot Transfer Details: Verbal cues for precautions/safety;Verbal cues for technique;Verbal  cues for sequencing;Manual facilitation for placement Transfer (Assistive device): Rolling walker  Locomotion  Gait Ambulation: No Gait Gait: No Stairs / Additional Locomotion Stairs: No Wheelchair Mobility Wheelchair Mobility: No   Trunk/Postural Assessment  Lumbar Assessment Lumbar Assessment: Exceptions to WFL(lumbar corset) Postural Control Postural Control: Deficits on evaluation Righting Reactions: delayed Protective Responses: delayed   Balance Balance Balance Assessed: Yes Static Sitting Balance Static Sitting - Balance Support: Feet supported;Bilateral upper extremity supported Static Sitting - Level of Assistance: 5: Stand by assistance  Extremity Assessment  RUE Assessment RUE Assessment: Within Functional Limits LUE Assessment LUE Assessment: Within Functional Limits RLE Assessment RLE Assessment: Not tested General Strength Comments: no buckling in weight bearing but maintains B knees flexed LLE Assessment General Strength Comments: no buckling in weight bearing but maintains B knees flexed    Refer to Care Plan for Long Term Goals  Recommendations for other services: Therapeutic Recreation  Stress  management  Discharge Criteria: Patient will be discharged from PT if patient refuses treatment 3 consecutive times without medical reason, if treatment goals not met, if there is a change in medical status, if patient makes no progress towards goals or if patient is discharged from hospital.  The above assessment, treatment plan, treatment alternatives and goals were discussed and mutually agreed upon: by patient  Waunita Schooner 05/02/2019, 10:45 AM

## 2019-05-02 NOTE — Evaluation (Signed)
Occupational Therapy Assessment and Plan  Patient Details  Name: Patrick Waters MRN: [id number] Date of Birth: 1950/11/27  OT Diagnosis: acute pain, lumbago (low back pain) and muscle weakness (generalized) Rehab Potential: Rehab Potential (ACUTE ONLY): Good ELOS: 2-2.5 weeks   Today's Date: 05/02/2019 OT Individual Time: 0800-0900 OT Individual Time Calculation (min): 60 min     Problem List:  Patient Active Problem List   Diagnosis Date Noted  . Lumbar radiculopathy 05/01/2019  . Idiopathic scoliosis of thoracolumbar region 04/27/2019  . Spondylolisthesis of cervical region 02/14/2018    Past Medical History:  Past Medical History:  Diagnosis Date  . Arthritis   . Asthma   . COPD (chronic obstructive pulmonary disease) ([place])   . Dyspnea    with exertion  . Family history of adverse reaction to anesthesia    Daughter- N/V  . GERD (gastroesophageal reflux disease)   . Hypertension   . Pneumonia   . Sleep apnea    uses cpap   Past Surgical History:  Past Surgical History:  Procedure Laterality Date  . ANAL FISSURE REPAIR    . BACK SURGERY     lower back  . cervial fusion     x 2  . COLONOSCOPY    . Hemmorhoid    . POSTERIOR CERVICAL FUSION/FORAMINOTOMY N/A 02/14/2018   Procedure: Cervical six-Thoracic one Posterior decompression and fusion;  Surgeon: Erline Levine, MD;  Location: [place];  Service: Neurosurgery;  Laterality: N/A;  . TRANSFORAMINAL LUMBAR INTERBODY FUSION (TLIF) WITH PEDICLE SCREW FIXATION 3 LEVEL Left 04/27/2019   Procedure: Left Lumbar two-three Lumbar three-four Lumbar four-five Transforaminal lumbar interbody fusion with redo decompression, pedicle screw fixation Lumbar two to Lumbar five;  Surgeon: Erline Levine, MD;  Location: [place];  Service: Neurosurgery;  Laterality: Left;  Left Lumbar two-three Lumbar three-four Lumbar four-five Transforaminal lumbar interbody fusion with redo decompression, pedicle    Assessment & Plan Clinical Impression:  Patrick Waters is a 68 year old right-handed male history ofprimary immunodeficiency receives Hyqvia50 g every 28 daysCOPD, prior lumbar and cervical back surgeries. Per chart review patient lives with spouse. He is retired independent driving. One level home with one-step to entry. Presented 04/27/2019 with persistent low back pain radiating to the lower extremities. X-rays and imaging revealed lumbar foraminal stenosis, spondylolisthesis, herniated disc, scoliosis with lumbar radiculopathy L2-3, 3-4 and 4-5. Underwent lumbar 2-3, 3-4 and 4-5 transforaminal lumbar interbody fusion with redo decompression, pedicle screw fixation 04/27/2019 per Dr. Vertell Limber. Hospital course pain management. Lumbar corset when out of bed. Tolerating a regular diet. Bouts of constipation with bowel program adjusted. Therapy evaluations completed and patient was admitted for a comprehensive rehab program. Patient transferred to CIR on 05/01/2019 .    Patient currently requires max with basic self-care skills secondary to muscle weakness and muscle paralysis, decreased cardiorespiratoy endurance and decreased standing balance, decreased postural control, decreased balance strategies and difficulty maintaining precautions.  Prior to hospitalization, patient could complete ADLs/IADLs with independent .  Patient will benefit from skilled intervention to decrease level of assist with basic self-care skills, increase independence with basic self-care skills and increase level of independence with iADL prior to discharge home with care partner.  Anticipate patient will require 24 hour supervision and follow up home health.  OT - End of Session Activity Tolerance: Tolerates 10 - 20 min activity with multiple rests Endurance Deficit: Yes Endurance Deficit Description: Required multiple rest breaks throughout bathing/dressing session OT Assessment Rehab Potential (ACUTE ONLY): Good OT Patient demonstrates impairments  in the  following area(s): Balance;Pain;Safety;Perception;Endurance;Sensory;Motor OT Basic ADL's Functional Problem(s): Grooming;Bathing;Dressing;Toileting OT Transfers Functional Problem(s): Toilet;Tub/Shower OT Additional Impairment(s): None OT Plan OT Intensity: Minimum of 1-2 x/day, 45 to 90 minutes OT Frequency: 5 out of 7 days OT Duration/Estimated Length of Stay: 2-2.5 weeks OT Treatment/Interventions: Balance/vestibular training;Community reintegration;Disease mangement/prevention;Functional electrical stimulation;Neuromuscular re-education;Patient/family education;Self Care/advanced ADL retraining;Splinting/orthotics;Therapeutic Exercise;UE/LE Coordination activities;UE/LE Strength taining/ROM;Wheelchair propulsion/positioning;Therapeutic Activities;Skin care/wound managment;Psychosocial support;Pain management;Functional mobility training;DME/adaptive equipment instruction;Discharge planning OT Self Feeding Anticipated Outcome(s): Indep OT Basic Self-Care Anticipated Outcome(s): Set-up/supervision OT Toileting Anticipated Outcome(s): Supervision OT Bathroom Transfers Anticipated Outcome(s): Supervision-min A OT Recommendation Recommendations for Other Services: Neuropsych consult Patient destination: Home Follow Up Recommendations: Home health OT Equipment Recommended: To be determined   Skilled Therapeutic Intervention Pt seen for OT eval and ADL bathing/dressing session. Pt awake in supine upon arrival, agreeable to tx session and denying pain. Pt agreeable to bathing at shower level. He transferred to sitting EOB with mod A using hospital bed functions. Used STEDY to transfer to [facility] in shower. Mod A for sit>stand from elevated EOB. He bathed seated on BSC in shower with assist to wash LEs as pt not able to cross LEs into figure four position. He stood with min A from [facility] into STEDY to transfer to w/c He dressed seated in w/c, set-up assist UB dressing and total A LB dressing. Mod A to  stand at sink in order for therapist to pull pants up, pt fearful to attempt to let go of sink support.  Pt left seated in w/c at end of session, all needs in reach. Reviewed use of call bell and need for assist with mobility. Pt able to recall 3/3 back pre-cautions with min cuing. Education provided throughout session regarding role of OT, POC, OT/PT goals, and d/c planning   OT Evaluation Precautions/Restrictions  Precautions Precautions: Fall;Back Required Braces or Orthoses: Spinal Brace Spinal Brace: Lumbar corset;Applied in sitting position(Can ambulate to bathroom and shower without brace) Restrictions Weight Bearing Restrictions: No General Chart Reviewed: Yes Home Living/Prior [place] expects to be discharged to:: Private residence Living Arrangements: Spouse/significant other Available Help at Discharge: Family, Available 24 hours/day Type of Home: House Home Access: Stairs to enter [employer] of Steps: 1 step + small threshold step Entrance Stairs-Rails: None Home Layout: One level Bathroom Shower/Tub: Multimedia programmer: Handicapped height  Lives With: Spouse IADL History Homemaking Responsibilities: No Current License: Yes Mode of Transportation: Musician Occupation: Retired Prior Function Level of Independence: Independent with basic ADLs, Independent with homemaking with ambulation, Independent with gait, Independent with transfers  Able to Take Stairs?: Yes Driving: Yes Vocation: Retired Comments: has 2 dogs ([place], [place]), family close with children and grandchildren Vision Baseline Vision/History: Wears glasses Wears Glasses: At all times Vision Assessment?: No apparent visual deficits Perception  Perception: Within Functional Limits Praxis Praxis: Intact Cognition Overall Cognitive Status: Within Functional Limits for tasks assessed Arousal/Alertness: Awake/alert Orientation Level:  Person;Place;Situation Person: Oriented Place: Oriented Situation: Oriented Year: 2020 Month: June Day of Week: Correct Memory: Appears intact Immediate Memory Recall: Sock;Blue;Bed Memory Recall: Sock;Blue;Bed Memory Recall Sock: Without Cue Memory Recall Blue: Without Cue Memory Recall Bed: Without Cue Awareness: Appears intact Problem Solving: Appears intact Safety/Judgment: Appears intact Sensation Sensation Light Touch: Impaired Detail Peripheral sensation comments: Reports tingling in B feet Proprioception: Not tested Coordination Gross Motor Movements are Fluid and Coordinated: No(B LE weakness) Fine Motor Movements are Fluid and Coordinated: Yes Coordination and Movement Description: Imapired due to generalized weakness, heavy  reliance on UEs for standing Motor  Motor Motor: Abnormal postural alignment and control Motor - Skilled Clinical Observations: generalized deconditioning Mobility  Bed Mobility Bed Mobility: Sit to Sidelying Left;Rolling Right(bed rails, HOB elevated) Rolling Right: Supervision/verbal cueing Sit to Sidelying Left: Moderate Assistance - Patient 50-74% Transfers Sit to Stand: Moderate Assistance - Patient 50-74% Stand to Sit: Moderate Assistance - Patient 50-74%  Trunk/Postural Assessment  Cervical Assessment Cervical Assessment: Within Functional Limits Thoracic Assessment Thoracic Assessment: Exceptions to WFL(Kyphotic; Rounded shoulders) Lumbar Assessment Lumbar Assessment: Exceptions to WFL(Back pre-cautions; lumbar corset) Postural Control Postural Control: Deficits on evaluation Righting Reactions: delayed Protective Responses: delayed  Balance Balance Balance Assessed: Yes Static Sitting Balance Static Sitting - Balance Support: Feet supported;No upper extremity supported Static Sitting - Level of Assistance: 5: Stand by assistance Dynamic Sitting Balance Dynamic Sitting - Balance Support: During functional activity;Feet  supported;No upper extremity supported Dynamic Sitting - Level of Assistance: 5: Stand by assistance;4: Min assist Sitting balance - Comments: Sitting on BSC to complete bathing task Static Standing Balance Static Standing - Balance Support: During functional activity;Bilateral upper extremity supported Static Standing - Level of Assistance: 4: Min assist Static Standing - Comment/# of Minutes: Standing at sink Dynamic Standing Balance Dynamic Standing - Balance Support: During functional activity;Bilateral upper extremity supported Dynamic Standing - Level of Assistance: 4: Min assist;3: Mod assist Dynamic Standing - Comments: Standing at sink to complete LB clothing management Extremity/Trunk Assessment RUE Assessment RUE Assessment: Within Functional Limits General Strength Comments: Generalized weakness, however, able to use [facility] during ADL tasks.  LUE Assessment LUE Assessment: Within Functional Limits General Strength Comments: Generalized weakness, however, able to use [facility] during ADL tasks.      Refer to Care Plan for Long Term Goals  Recommendations for other services: Neuropsych and Therapeutic Recreation  Pet therapy, Stress management and Outing/community reintegration   Discharge Criteria: Patient will be discharged from OT if patient refuses treatment 3 consecutive times without medical reason, if treatment goals not met, if there is a change in medical status, if patient makes no progress towards goals or if patient is discharged from hospital.  The above assessment, treatment plan, treatment alternatives and goals were discussed and mutually agreed upon: by patient  Erinn Huskins L 05/02/2019, 1:25 PM

## 2019-05-02 NOTE — Progress Notes (Signed)
Physical Therapy Session Note  Patient Details  Name: Patrick Waters MRN: [id number] Date of Birth: July 08, 1951  Today's Date: 05/02/2019 PT Individual Time: 1300-1345 PT Individual Time Calculation (min): 45 min   Short Term Goals: Week 1:  PT Short Term Goal 1 (Week 1): Pt will complete bed mobility with min assist. PT Short Term Goal 2 (Week 1): Pt will ambulate 50 ft with LRAD & min assist. PT Short Term Goal 3 (Week 1): Pt will negotiate 2 steps without rails with LRAD & mod assist to simulate home entrance. PT Short Term Goal 4 (Week 1): Pt will complete all transfers with min assist & LRAD. PT Short Term Goal 5 (Week 1): Pt will propel w/c 50 ft with supervision.  Skilled Therapeutic Interventions/Progress Updates:  Pt received supine in bed and agreeable to PT. Supine>sit transfer with  assist and min cues for use of log roll and min assist from PT. PT assisted to don shoes at EOB as well as brace. Supervision assist for sitting Balance. Pt reports need for BM. Stedy transfer to toilet with min assist to pull to standing and return to sitting on BSC over toilet. Pt able to have bowel movement. PT performed pericare and clothing management while pt standing in stedy. Pt returned to room and performed stedy transfer to bed with min assist to power into standing. Pt able to don LSO with supervision assist sitting EOB. Sit>supine completed with mod assist for BLE management. PT instructed pt in supine BLE therex. SLR 2 x 5 BLE, hip abduction 2 x 5, and heel slides x 8. Min cues for full ROM and decreased speed from PT. Pt the left supine in bed with call bell in reach and all needs met.      Therapy Documentation Precautions:  Precautions Precautions: Fall, Back Required Braces or Orthoses: Spinal Brace Spinal Brace: Lumbar corset, Applied in sitting position(Can ambulate to bathroom and shower without brace) Restrictions Weight Bearing Restrictions: No General: Chart Reviewed:  Yes Additional Pertinent History: COPD, hx of lumbar/cervical surgery, arthritis, asthma, dyspnea, HTN PT Amount of Missed Time (min): 15 Minutes PT Missed Treatment Reason: Patient fatigue Response to Previous Treatment: (pt with significant c/o fatigue) Family/Caregiver Present: No Vital Signs: Therapy Vitals Temp: 99 F (37.2 C) Temp Source: Oral Pulse Rate: (!) 113 Resp: 18 BP: 91/68 Patient Position (if appropriate): Lying Oxygen Therapy SpO2: 92 % O2 Device: Room Air Pain: Pain Assessment Pain Scale: 0-10 Pain Score: 6  Pain Type: Surgical pain Pain Location: Back Pain Orientation: Lower Pain Descriptors / Indicators: Aching    Therapy/Group: Individual Therapy  Lorie Phenix 05/02/2019, 1:57 PM

## 2019-05-02 NOTE — Plan of Care (Signed)
  Problem: Consults Goal: RH SPINAL CORD INJURY PATIENT EDUCATION Description  See Patient Education module for education specifics.  Outcome: Progressing   Problem: SCI BOWEL ELIMINATION Goal: RH STG MANAGE BOWEL WITH ASSISTANCE Description STG Manage Bowel with Assistance. Outcome: Progressing

## 2019-05-03 ENCOUNTER — Inpatient Hospital Stay (HOSPITAL_COMMUNITY): Payer: Medicare Other

## 2019-05-03 ENCOUNTER — Inpatient Hospital Stay (HOSPITAL_COMMUNITY): Payer: Medicare Other | Admitting: Occupational Therapy

## 2019-05-03 DIAGNOSIS — M7989 Other specified soft tissue disorders: Secondary | ICD-10-CM

## 2019-05-03 LAB — CBC
HCT: 36 % — ABNORMAL LOW (ref 39.0–52.0)
Hemoglobin: 12.7 g/dL — ABNORMAL LOW (ref 13.0–17.0)
MCH: 31.1 pg (ref 26.0–34.0)
MCHC: 35.3 g/dL (ref 30.0–36.0)
MCV: 88.2 fL (ref 80.0–100.0)
Platelets: 189 10*3/uL (ref 150–400)
RBC: 4.08 MIL/uL — ABNORMAL LOW (ref 4.22–5.81)
RDW: 12.5 % (ref 11.5–15.5)
WBC: 14.1 10*3/uL — ABNORMAL HIGH (ref 4.0–10.5)
nRBC: 0 % (ref 0.0–0.2)

## 2019-05-03 LAB — URINALYSIS, COMPLETE (UACMP) WITH MICROSCOPIC
Bacteria, UA: NONE SEEN
Bilirubin Urine: NEGATIVE
Glucose, UA: NEGATIVE mg/dL
Hgb urine dipstick: NEGATIVE
Ketones, ur: NEGATIVE mg/dL
Leukocytes,Ua: NEGATIVE
Nitrite: NEGATIVE
Protein, ur: NEGATIVE mg/dL
Specific Gravity, Urine: 1.013 (ref 1.005–1.030)
pH: 5 (ref 5.0–8.0)

## 2019-05-03 MED ORDER — RIVAROXABAN 20 MG PO TABS
20.0000 mg | ORAL_TABLET | Freq: Once | ORAL | Status: AC
  Administered 2019-05-03: 20 mg via ORAL
  Filled 2019-05-03: qty 1

## 2019-05-03 MED ORDER — RIVAROXABAN 20 MG PO TABS: 20.0000 mg | ORAL_TABLET | Freq: Every day | ORAL | Status: DC

## 2019-05-03 MED ORDER — OMALIZUMAB 150 MG/ML ~~LOC~~ SOSY
375.0000 mg | PREFILLED_SYRINGE | Freq: Once | SUBCUTANEOUS | Status: DC
  Filled 2019-05-03 (×2): qty 3

## 2019-05-03 NOTE — Progress Notes (Signed)
ANTICOAGULATION CONSULT NOTE - Initial Consult  Pharmacy Consult for Xarelto Indication: New Acute RLE DVT  Patient Measurements: Height: 5\' 10"  (177.8 cm) Weight: 232 lb 9.4 oz (105.5 kg) IBW/kg (Calculated) : 73  Vital Signs: Temp: 98.3 F (36.8 C) (06/11 0518) Temp Source: Oral (06/11 0518) BP: 80/55 (06/11 0923) Pulse Rate: 94 (06/11 0923)  Labs: Recent Labs    05/02/19 0610 05/03/19 0630  HGB 12.6* 12.7*  HCT 35.9* 36.0*  PLT 213 189  CREATININE 0.78  --     Estimated Creatinine Clearance: 106 mL/min (by C-G formula based on SCr of 0.78 mg/dL).   Medical History: Past Medical History:  Diagnosis Date  . Arthritis   . Asthma   . COPD (chronic obstructive pulmonary disease) ([place])   . Dyspnea    with exertion  . Family history of adverse reaction to anesthesia    Daughter- N/V  . GERD (gastroesophageal reflux disease)   . Hypertension   . Pneumonia   . Sleep apnea    uses cpap     Assessment: 8 YOM s/p recent spinal surgery on 6/5 now with RLE swelling and dopplers confirming RLE DVT. Pharmacy has been consulted for Xarelto dosing.   Given the patient's recent spinal surgery - the benefits/risks of continuing with the induction dosing of 15 mg bid x 3 weeks then 20 mg once daily vs going ahead and starting with the 20 mg once daily dosing was discussed with the PA/MD. The decision was made to go ahead and start with the lower dosing of 20 mg once daily given their risk of bleeding due to recent spinal surgery (<1 week ago)  Goal of Therapy:  Appropriate anticoagulation for indication and hepatic/renal function    Plan:  - Start Xarelto 20 mg once daily with supper - first dose now - Will monitor for bleeding given recent spinal surgery   Thank you for allowing pharmacy to be a part of this patient's care.  Alycia Rossetti, PharmD, BCPS Clinical Pharmacist Clinical phone for 05/03/2019: [phone] 05/03/2019 11:52 AM   **Pharmacist phone directory can  now be found on [email or web address] ([email or web address]).  Listed under [place].

## 2019-05-03 NOTE — Plan of Care (Signed)
  Problem: Consults Goal: RH SPINAL CORD INJURY PATIENT EDUCATION Description:  See Patient Education module for education specifics.  Outcome: Progressing Goal: Skin Care Protocol Initiated - if Braden Score 18 or less Description: If consults are not indicated, leave blank or document N/A Outcome: Progressing Goal: Nutrition Consult-if indicated Outcome: Progressing   Problem: SCI BOWEL ELIMINATION Goal: RH STG MANAGE BOWEL WITH ASSISTANCE Description: STG Manage Bowel with Assistance. Outcome: Progressing Goal: RH STG SCI MANAGE BOWEL WITH MEDICATION WITH ASSISTANCE Description: STG SCI Manage bowel with medication with assistance. Outcome: Progressing Goal: RH STG MANAGE BOWEL W/EQUIPMENT W/ASSISTANCE Description: STG Manage Bowel With Equipment With Assistance Outcome: Progressing Goal: RH STG SCI MANAGE BOWEL PROGRAM W/ASSIST OR AS APPROPRIATE Description: STG SCI Manage bowel program w/assist or as appropriate. Outcome: Progressing Goal: RH OTHER STG BOWEL ELIMINATION GOALS W/ASSIST Description: Other STG Bowel Elimination Goals With Assistance. Outcome: Progressing   Problem: RH SKIN INTEGRITY Goal: RH STG SKIN FREE OF INFECTION/BREAKDOWN Outcome: Progressing Goal: RH STG MAINTAIN SKIN INTEGRITY WITH ASSISTANCE Description: STG Maintain Skin Integrity With Assistance. Outcome: Progressing   Problem: RH SAFETY Goal: RH STG ADHERE TO SAFETY PRECAUTIONS W/ASSISTANCE/DEVICE Description: STG Adhere to Safety Precautions With Assistance/Device. Outcome: Progressing   Problem: RH PAIN MANAGEMENT Goal: RH STG PAIN MANAGED AT OR BELOW PT'S PAIN GOAL Outcome: Progressing   Problem: RH KNOWLEDGE DEFICIT SCI Goal: RH STG INCREASE KNOWLEDGE OF SELF CARE AFTER SCI Outcome: Progressing   

## 2019-05-03 NOTE — Progress Notes (Signed)
Vascular studies revealed right mid femoral, popliteal and tibial peritoneal DVT.  Discussed findings with neurosurgery Dr. Vertell Limber recommendations are for Xarelto

## 2019-05-03 NOTE — Progress Notes (Signed)
Occupational Therapy Session Note  Patient Details  Name: Patrick Waters MRN: [id number] Date of Birth: [birth year]  Today's Date: 05/03/2019 OT Individual Time: 1400-1500 OT Individual Time Calculation (min): 60 min    Short Term Goals: Week 1:  OT Short Term Goal 1 (Week 1): Pt will complete 2/3 toileting tasks with steadying assist OT Short Term Goal 2 (Week 1): Pt will don pants with steadying assist using AE PRN OT Short Term Goal 3 (Week 1): Pt will stand to complete 1 grooming task with steadying assist in order to increase functional standing endurance  Skilled Therapeutic Interventions/Progress Updates:    1:1. Pt received in w/c with pain premedicated but still present in back. Pt agreeable to tx with rest PRN. Pt practices doffing and donning socks with AE and min VC for technique. Pt dons LSO with A to bring around back. Pt propels w/c to/from tx spaces part way with superovision. Pt completes stand pivot transfer iwht mIN A and VC for anterior weight shift forward and hand placement using RW w/c<>EOM/recliner. Pt completes standing pipe tree activity in 2 standing rounds iwht MIN A for balance and VC for rest break when knees visibly shaking. Educated pt on posterior method of shower transfer with built in shower stool in corner as this is set up at home. Pt declines practicing but verbalizes understnading of method. Exited session with pt seated in bed, exit alram on and call light in reach  Therapy Documentation Precautions:  Precautions Precautions: Fall, Back Required Braces or Orthoses: Spinal Brace Spinal Brace: Lumbar corset, Applied in sitting position(Can ambulate to bathroom and shower without brace) Restrictions Weight Bearing Restrictions: No General:   Vital Signs: Therapy Vitals Temp: 98.3 F (36.8 C) Temp Source: Oral Pulse Rate: 94 Resp: 18 BP: 101/73 Patient Position (if appropriate): Sitting Oxygen Therapy SpO2: 98 % O2 Device: Room Air Pain:    ADL:   Vision   Perception    Praxis   Exercises:   Other Treatments:     Therapy/Group: Individual Therapy  [name] 05/03/2019, 3:02 PM

## 2019-05-03 NOTE — Progress Notes (Signed)
Slept well throughout the night. Medicated x2 for c/o lower back pain-effective.

## 2019-05-03 NOTE — Progress Notes (Signed)
Physical Therapy Session Note  Patient Details  Name: Patrick Waters MRN: [id number] Date of Birth: 1951/05/25  Today's Date: 05/03/2019 PT Individual Time: 0803-0903 PT Individual Time Calculation (min): 60 min   Short Term Goals: Week 1:  PT Short Term Goal 1 (Week 1): Pt will complete bed mobility with min assist. PT Short Term Goal 2 (Week 1): Pt will ambulate 50 ft with LRAD & min assist. PT Short Term Goal 3 (Week 1): Pt will negotiate 2 steps without rails with LRAD & mod assist to simulate home entrance. PT Short Term Goal 4 (Week 1): Pt will complete all transfers with min assist & LRAD. PT Short Term Goal 5 (Week 1): Pt will propel w/c 50 ft with supervision.  Skilled Therapeutic Interventions/Progress Updates:     Patient in bed upon PT arrival. Patient alert and agreeable to PT session.  Therapeutic Activity: Bed Mobility: Patient performed supine to sit with mod A with HOB elevated, has elevated HOB at home. Provided verbal cues for log roll technique to maintain spinal precautions. Transfers: Patient performed sit to/from stand x2 with min A using RW and min A-CGA for a toilet transfer with the [facility]. Provided verbal cues for hand placement and reaching back to sit. Required total A for peri care and max A for LB dressing during toileting.   Patient washed his hands and brushed his teeth sitting in the w/c following toileting.  Therapeutic Exercise: Patient performed the following exercises with verbal and tactile cues for proper technique. -B ankle pumps x20 -B quad sets x10 -B glute sets x10 -B heel slides x10  Patient in w/c at end of session with breaks locked, seat belt alarm set, and all needs within reach.    Therapy Documentation Precautions:  Precautions Precautions: Fall, Back Required Braces or Orthoses: Spinal Brace Spinal Brace: Lumbar corset, Applied in sitting position(Can ambulate to bathroom and shower without brace) Restrictions Weight Bearing  Restrictions: No Pain: Pain Assessment Pain Score: 6/10 Pain Location: Low back, middle Pain descriptors: aching Pain intervention: reposition;distraction;relaxion techniques    Therapy/Group: Individual Therapy  Denyse Fillion L Evadne Ose PT, DPT  05/03/2019, 12:42 PM

## 2019-05-03 NOTE — Progress Notes (Signed)
[facility] PHYSICAL MEDICINE & REHABILITATION PROGRESS NOTE   Subjective/Complaints: Had a reasonable night. Felt better after moving bowels yesterday.   ROS: Patient denies fever, rash, sore throat, blurred vision, nausea, vomiting, diarrhea, cough, shortness of breath or chest pain,  headache, or mood change.   .    Objective:   No results found. Recent Labs    05/02/19 0610 05/03/19 0630  WBC 12.4* 14.1*  HGB 12.6* 12.7*  HCT 35.9* 36.0*  PLT 213 189   Recent Labs    05/02/19 0610  NA 130*  K 3.9  CL 96*  CO2 24  GLUCOSE 117*  BUN 16  CREATININE 0.78  CALCIUM 8.1*    Intake/Output Summary (Last 24 hours) at 05/03/2019 0908 Last data filed at 05/03/2019 6433 Gross per 24 hour  Intake 419 ml  Output 1000 ml  Net -581 ml     Physical Exam: Vital Signs Blood pressure 109/70, pulse 83, temperature 98.3 F (36.8 C), temperature source Oral, resp. rate 16, height 5\' 10"  (1.778 m), weight 105.5 kg, SpO2 95 %. Constitutional: No distress . Vital signs reviewed. HEENT: EOMI, oral membranes moist Neck: supple Cardiovascular: RRR without murmur. No JVD    Respiratory: CTA Bilaterally without wheezes or rales. Normal effort    GI: BS +, non-tender, non-distended  Musculoskeletal:  General: Edema: LE edema. Neurological: He is alertand oriented to person, place, and time. Nocranial nerve deficit. Patient is alert in no acute distress. Follows full commands.UE 4/5. LE 3/5 HF, KE and 4-/5 ADF/PF. No focal sensory changes. --stable exam Skin: Skin iswarm. Back incision CDI with honeycomb dressing in place Psychiatric: pleasant     Assessment/Plan: 1. Functional deficits secondary to lumbar stenosis with radiculopathy which require 3+ hours per day of interdisciplinary therapy in a comprehensive inpatient rehab setting.  Physiatrist is providing close team supervision and 24 hour management of active medical problems listed below.  Physiatrist and  rehab team continue to assess barriers to discharge/monitor patient progress toward functional and medical goals  Care Tool:  Bathing    Body parts bathed by patient: Right arm, Left arm, Chest, Abdomen, Front perineal area, Face, Right upper leg, Left upper leg   Body parts bathed by helper: Buttocks, Right lower leg, Left lower leg     Bathing assist Assist Level: Moderate Assistance - Patient 50 - 74%     Upper Body Dressing/Undressing Upper body dressing   What is the patient wearing?: Pull over shirt, Orthosis Orthosis activity level: Performed by patient  Upper body assist Assist Level: Supervision/Verbal cueing    Lower Body Dressing/Undressing Lower body dressing      What is the patient wearing?: Underwear/pull up, Pants     Lower body assist Assist for lower body dressing: Total Assistance - Patient < 25%     Toileting Toileting    Toileting assist Assist for toileting: Independent with assistive device Assistive Device Comment: (Urinal)   Transfers Chair/bed transfer  Transfers assist  Chair/bed transfer activity did not occur: Safety/medical concerns  Chair/bed transfer assist level: Dependent - mechanical lift(STEDY)     Locomotion Ambulation   Ambulation assist   Ambulation activity did not occur: Safety/medical concerns          Walk 10 feet activity   Assist  Walk 10 feet activity did not occur: Safety/medical concerns        Walk 50 feet activity   Assist Walk 50 feet with 2 turns activity did not occur: Safety/medical concerns  Walk 150 feet activity   Assist Walk 150 feet activity did not occur: Safety/medical concerns         Walk 10 feet on uneven surface  activity   Assist Walk 10 feet on uneven surfaces activity did not occur: Safety/medical concerns         Wheelchair     Assist Will patient use wheelchair at discharge?: Yes Type of Wheelchair: Manual Wheelchair activity did not occur:  Safety/medical concerns         Wheelchair 50 feet with 2 turns activity    Assist    Wheelchair 50 feet with 2 turns activity did not occur: Safety/medical concerns       Wheelchair 150 feet activity     Assist Wheelchair 150 feet activity did not occur: Safety/medical concerns         Medical Problem List and Plan: 1.Decreased functional mobilitysecondary to lumbar stenosis, spondylolisthesis with radiculopathy. Status post L2-3, 3-4 and 4-5 lumbar interbody fusion with pedicle screw fixation 04/27/2019. Lumbar corset when out of bed --Continue CIR therapies including PT, OT  2. Antithrombotics: -DVT/anticoagulation:SCDs. dopplers pending -antiplatelet therapy: N/A 3. Pain Management:Neurontin 300 mg 3 times daily,oxycodoneand Robaxin as needed 4. Mood:Provide emotional support -antipsychotic agents: N/A 5. Neuropsych: This patientiscapable of making decisions on hisown behalf. 6. Skin/Wound Care:Routine skin checks 7. Fluids/Electrolytes/Nutrition:encourage PO     8.Hypertension. Norvasc 10 mg daily, lisinopril 20 mg daily. Monitor with increased mobility  -controlled 611 9. COPD/asthma. Continue nebulizers as directed 10.Primary immunodeficiency. Patient receives HYQVIA50 g every 28 days with next scheduled dose 05/04/2019. Wife is to bring in supply. Infusion process to be d/w nursing team 11. GERD. Protonix 12. Hyperlipidemia. Pravachol 13. Constipation. +bm yesterday  -senokot-s bid    14. Leukocytosis:  increased further today  -check UA, UCX  -chest clear, wound cdi  -dopplers pending    LOS: 2 days A FACE TO FACE EVALUATION WAS PERFORMED  Ranelle OysterZachary T Makana Feigel 05/03/2019, 9:08 AM

## 2019-05-03 NOTE — Progress Notes (Signed)
Bilateral lower extremity venous duplex completed. Refer to "CV Proc" under chart review to view preliminary results.  Critical results discussed with Lavonna Rua, RN and Lauraine Rinne, PA-C.  05/03/2019 10:41 AM Maudry Mayhew, MHA, RVT, RDCS, RDMS

## 2019-05-03 NOTE — Progress Notes (Signed)
Occupational Therapy Session Note  Patient Details  Name: Patrick Waters MRN: [id number] Date of Birth: 10-07-1951  Today's Date: 05/03/2019 OT Individual Time: 1100-1115 and 1300-1345 OT Individual Time Calculation (min): 15 min and 45 min   Short Term Goals: Week 1:  OT Short Term Goal 1 (Week 1): Pt will complete 2/3 toileting tasks with steadying assist OT Short Term Goal 2 (Week 1): Pt will don pants with steadying assist using AE PRN OT Short Term Goal 3 (Week 1): Pt will stand to complete 1 grooming task with steadying assist in order to increase functional standing endurance  Skilled Therapeutic Interventions/Progress Updates:    SEssion One: PT seen for OT session focusing on functional mobility and education. Pt awake in supine upon arrival, agreeable to tx session. Complaints of generalized pain in back, but willing to cont as able. Pt reports increased fatigue from 8:00 session this morning. Discussed option of transitioning to 15/7 therapy schedule if pt not able to handle therapy load at this time. Pt desires to cont on current schedule for now and will re-assess later. He transferred to sitting EOB with HOB elevated, multi-modal cuing for log rolling technique and min A overall.  Pt able to recall 2/3 back pre-cautions.  Therapist donned TED hose and non-slid socks total A.  RN then alerted therapist of results just in from dopplars, pt positive for DVT. Consulted with PA who also came to speak with pt. PA placed pt on bedrest at this time until first dose of medication. PA gave clearance once medication administered.  Pt returned to supine and left with all needs in reach.  Therapist will attempt to make-up time as pt able.  Pt voices strong desire to go home, hopes he is on rehab less than 1 week. Educated regarding PT/OT goals, team conference next week and pt's goals and available assist at d/c. Will cont to work to determine pt goals and safe d/c home.   Session Two:  Therapist given clearance by RN to resume OOB tx session. Pt in supine upon arrival, agreable to tx session. RN already aware of complaints of back pain, 7/10 and administered pain medication at start of session. Pt transferred to sitting EOB with supervision using hospital bed functions, independently recalling log rolling technique.  Seated EOB, education and demonstration provided regarding AE training including use of reacher and sock aid. Pt able to use AE to doff and don non-slid socks with min cuing for technique. Completed sit>stand from EOB to RW with min A and VCs for hand placement. Pt with complaints of dizziness upon standing. Returned to sitting, BP sitting EOB: 102/91, BP in standing 102/91. RN made aware of orthostatic readings. Pt completed min A stand pivot transfer to recliner. Voiced need for toileting task. Ambulated ~72ft into bathroom with Min A using RW with VCs for technique of managing RW over threshold. Completed toileting task with max A. Pt unable to walk out of bathroom 2/2 fatigue, stand pivot to w/c instead.  Pt left seated in w/c at end of session, all needs in reach.  Education provided throughout session regarding AE, energy conservation, importance of participation with ADLs, reducing caregiver burden, benefits of OOB, and d/c planning.   Therapy Documentation Precautions:  Precautions Precautions: Fall, Back Required Braces or Orthoses: Spinal Brace Spinal Brace: Lumbar corset, Applied in sitting position(Can ambulate to bathroom and shower without brace) Restrictions Weight Bearing Restrictions: No   Therapy/Group: Individual Therapy  [name] L 05/03/2019, 7:03 AM

## 2019-05-03 NOTE — Progress Notes (Signed)
Linna Hoff, PA made aware of doppler exam results. Xarelto added. Continue plan of care.   Patrick Waters

## 2019-05-04 ENCOUNTER — Inpatient Hospital Stay (HOSPITAL_COMMUNITY): Payer: Medicare Other | Admitting: Occupational Therapy

## 2019-05-04 ENCOUNTER — Inpatient Hospital Stay (HOSPITAL_COMMUNITY): Payer: Medicare Other | Admitting: Physical Therapy

## 2019-05-04 LAB — CBC
HCT: 38 % — ABNORMAL LOW (ref 39.0–52.0)
Hemoglobin: 13 g/dL (ref 13.0–17.0)
MCH: 30.5 pg (ref 26.0–34.0)
MCHC: 34.2 g/dL (ref 30.0–36.0)
MCV: 89.2 fL (ref 80.0–100.0)
Platelets: 238 10*3/uL (ref 150–400)
RBC: 4.26 MIL/uL (ref 4.22–5.81)
RDW: 13 % (ref 11.5–15.5)
WBC: 15.5 10*3/uL — ABNORMAL HIGH (ref 4.0–10.5)
nRBC: 0 % (ref 0.0–0.2)

## 2019-05-04 MED ORDER — RIVAROXABAN 15 MG PO TABS
15.0000 mg | ORAL_TABLET | Freq: Two times a day (BID) | ORAL | Status: DC
  Administered 2019-05-04 – 2019-05-11 (×15): 15 mg via ORAL
  Filled 2019-05-04 (×16): qty 1

## 2019-05-04 MED ORDER — RIVAROXABAN 20 MG PO TABS: 20.0000 mg | ORAL_TABLET | Freq: Every day | ORAL | Status: DC

## 2019-05-04 MED ORDER — IMMUNE GLOBULIN-HYALURONIDASE 20 GM/200ML ~~LOC~~ KIT
50.0000 g | PACK | Freq: Once | SUBCUTANEOUS | Status: AC
  Administered 2019-05-04: 50 g via SUBCUTANEOUS

## 2019-05-04 NOTE — Progress Notes (Signed)
[facility] Individual Statement of Services  Patient Name:  Patrick Waters  Date:  05/04/2019  Welcome to the [facility].  Our goal is to provide you with an individualized program based on your diagnosis and situation, designed to meet your specific needs.  With this comprehensive rehabilitation program, you will be expected to participate in at least 3 hours of rehabilitation therapies Monday-Friday, with modified therapy programming on the weekends.  Your rehabilitation program will include the following services:  Physical Therapy (PT), Occupational Therapy (OT), 24 hour per day rehabilitation nursing, Case Management (Social Worker), Rehabilitation Medicine, Nutrition Services and Pharmacy Services  Weekly team conferences will be held on Tuesdays to discuss your progress.  Your Social Worker will talk with you frequently to get your input and to update you on team discussions.  Team conferences with you and your family in attendance may also be held.  Expected length of stay:  2- 2 1/2 weeks Overall anticipated outcome:  Supervision with minimal assistance for stairs  Depending on your progress and recovery, your program may change. Your Social Worker will coordinate services and will keep you informed of any changes. Your Social Worker's name and contact numbers are listed  below.  The following services may also be recommended but are not provided by the [facility] will be made to provide these services after discharge if needed.  Arrangements include referral to agencies that provide these services.  Your insurance has been verified to be:  Medicare and Medicare Supplement, Plan G Your primary doctor is:  Dr. Estevan Oaks  Pertinent information will be shared with your doctor and your insurance  company.  Social Worker:  Alfonse Alpers, LCSW  [phone] or [phone]  Information discussed with and copy given to patient by: Trey Sailors, 05/04/2019, 5:10 PM

## 2019-05-04 NOTE — Progress Notes (Signed)
[facility] PHYSICAL MEDICINE & REHABILITATION PROGRESS NOTE   Subjective/Complaints: Pt up at sink with LSO on, SOB with exertion of leaning forward while in brace.   ROS: Patient denies fever, rash, sore throat, blurred vision, nausea, vomiting, diarrhea, cough,  chest pain,  headache, or mood change. .   .    Objective:   [place] Lower Extremity Venous (dvt)  Result Date: 05/03/2019  Lower Venous Study Indications: Swelling.  Risk Factors: Surgery: recent back surgery. Comparison Study: No prior study. Performing Technologist: Gertie FeyMichelle Simonetti MHA, RDMS, RVT, RDCS  Examination Guidelines: A complete evaluation includes B-mode imaging, spectral Doppler, color Doppler, and power Doppler as needed of all accessible portions of each vessel. Bilateral testing is considered an integral part of a complete examination. Limited examinations for reoccurring indications may be performed as noted.  +----------------+---------------+---------+-----------+----------+------------+ RIGHT           CompressibilityPhasicitySpontaneityPropertiesSummary      +----------------+---------------+---------+-----------+----------+------------+ CFV             Full           No       Yes                  pulsatile                                                                 flow         +----------------+---------------+---------+-----------+----------+------------+ SFJ             Full                                                      +----------------+---------------+---------+-----------+----------+------------+ FV Prox         Full                                                      +----------------+---------------+---------+-----------+----------+------------+ FV Mid          Partial                 Yes                  Acute        +----------------+---------------+---------+-----------+----------+------------+ FV Distal       Full                                                       +----------------+---------------+---------+-----------+----------+------------+ PFV             Full                                                      +----------------+---------------+---------+-----------+----------+------------+  POP             None           No       Yes                  Acute.                                                                    Pulsatile                                                                 flow         +----------------+---------------+---------+-----------+----------+------------+ PTV             Full                    Yes                               +----------------+---------------+---------+-----------+----------+------------+ PERO            Full                    Yes                               +----------------+---------------+---------+-----------+----------+------------+ Tibioperoneal   None           No       Yes                  Acute.       trunk                                                        Pulsatile                                                                 flow         +----------------+---------------+---------+-----------+----------+------------+   +---------+---------------+---------+-----------+----------+------------------+ LEFT     CompressibilityPhasicitySpontaneityPropertiesSummary            +---------+---------------+---------+-----------+----------+------------------+ CFV      Full           No       Yes                  Slightly pulsatile +---------+---------------+---------+-----------+----------+------------------+ SFJ      Full                                                            +---------+---------------+---------+-----------+----------+------------------+  FV Prox  Full                                                             +---------+---------------+---------+-----------+----------+------------------+ FV Mid   Full                                                            +---------+---------------+---------+-----------+----------+------------------+ FV DistalFull                                                            +---------+---------------+---------+-----------+----------+------------------+ PFV      Full                                                            +---------+---------------+---------+-----------+----------+------------------+ POP      Full           No       Yes                  Slightly pulsatile +---------+---------------+---------+-----------+----------+------------------+ PTV      Full                                                            +---------+---------------+---------+-----------+----------+------------------+ PERO     Full                                                            +---------+---------------+---------+-----------+----------+------------------+     Summary: Right: Findings consistent with acute deep vein thrombosis involving the right mid femoral vein, right popliteal vein, and right tibioperoneal trunk. No cystic structure found in the popliteal fossa. Left: There is no evidence of deep vein thrombosis in the lower extremity. No cystic structure found in the popliteal fossa.  *See table(s) above for measurements and observations. Electronically signed by Lemar Livings MD on 05/03/2019 at 5:34:17 PM.    Final    Recent Labs    05/03/19 0630 05/04/19 0728  WBC 14.1* 15.5*  HGB 12.7* 13.0  HCT 36.0* 38.0*  PLT 189 238   Recent Labs    05/02/19 0610  NA 130*  K 3.9  CL 96*  CO2 24  GLUCOSE 117*  BUN 16  CREATININE 0.78  CALCIUM 8.1*    Intake/Output Summary (Last 24 hours) at 05/04/2019 1029 Last data filed at 05/04/2019 0702 Gross per 24 hour  Intake 480  ml  Output 950 ml  Net -470 ml     Physical Exam: Vital  Signs Blood pressure 120/65, pulse (!) 127, temperature 97.6 F (36.4 C), temperature source Oral, resp. rate 20, height 5\' 10"  (1.778 m), weight 105.5 kg, SpO2 92 %. Constitutional: No distress . Vital signs reviewed. HEENT: EOMI, oral membranes moist Neck: supple Cardiovascular: RRR without murmur. No JVD    Respiratory: CTA Bilaterally without wheezes or rales. Working to breath a bit with ADL's  GI: BS +, non-tender, non-distended  Musculoskeletal:  General: Edema: LE edema. Neurological: He is alertand oriented to person, place, and time. Nocranial nerve deficit. Patient is alert in no acute distress. Follows full commands.UE 4/5. LE 3/5 HF, KE and 4-/5 ADF/PF. No focal sensory changes. exam stable Skin: Skin iswarm. Back incision CDI with honeycomb dressing remains in place Psychiatric: pleasant     Assessment/Plan: 1. Functional deficits secondary to lumbar stenosis with radiculopathy which require 3+ hours per day of interdisciplinary therapy in a comprehensive inpatient rehab setting.  Physiatrist is providing close team supervision and 24 hour management of active medical problems listed below.  Physiatrist and rehab team continue to assess barriers to discharge/monitor patient progress toward functional and medical goals  Care Tool:  Bathing    Body parts bathed by patient: Right arm, Left arm, Chest, Abdomen, Front perineal area, Right upper leg, Left upper leg, Right lower leg, Left lower leg, Face   Body parts bathed by helper: Buttocks     Bathing assist Assist Level: Minimal Assistance - Patient > 75%     Upper Body Dressing/Undressing Upper body dressing   What is the patient wearing?: Pull over shirt Orthosis activity level: Performed by patient  Upper body assist Assist Level: Minimal Assistance - Patient > 75%    Lower Body Dressing/Undressing Lower body dressing      What is the patient wearing?: Pants     Lower body assist  Assist for lower body dressing: Moderate Assistance - Patient 50 - 74%     Toileting Toileting    Toileting assist Assist for toileting: Moderate Assistance - Patient 50 - 74% Assistive Device Comment: (Urinal)   Transfers Chair/bed transfer  Transfers assist  Chair/bed transfer activity did not occur: Safety/medical concerns  Chair/bed transfer assist level: Minimal Assistance - Patient > 75%     Locomotion Ambulation   Ambulation assist   Ambulation activity did not occur: Safety/medical concerns          Walk 10 feet activity   Assist  Walk 10 feet activity did not occur: Safety/medical concerns        Walk 50 feet activity   Assist Walk 50 feet with 2 turns activity did not occur: Safety/medical concerns         Walk 150 feet activity   Assist Walk 150 feet activity did not occur: Safety/medical concerns         Walk 10 feet on uneven surface  activity   Assist Walk 10 feet on uneven surfaces activity did not occur: Safety/medical concerns         Wheelchair     Assist Will patient use wheelchair at discharge?: Yes Type of Wheelchair: Manual Wheelchair activity did not occur: Safety/medical concerns         Wheelchair 50 feet with 2 turns activity    Assist    Wheelchair 50 feet with 2 turns activity did not occur: Safety/medical concerns       Wheelchair 150 feet  activity     Assist Wheelchair 150 feet activity did not occur: Safety/medical concerns         Medical Problem List and Plan: 1.Decreased functional mobilitysecondary to lumbar stenosis, spondylolisthesis with radiculopathy. Status post L2-3, 3-4 and 4-5 lumbar interbody fusion with pedicle screw fixation 04/27/2019. Lumbar corset when out of bed --Continue CIR therapies including PT, OT  2. Antithrombotics: -pt with  DVT's of right mid femoral vein, right popliteal, right tib-peronal  -he is high risk for further thrombus/PE.  Spoke to NS who felt it was safe to use typical initial treatment doses of xarelto, 15mg  bid  -antiplatelet therapy: N/A 3. Pain Management:Neurontin 300 mg 3 times daily,oxycodoneand Robaxin as needed 4. Mood:Provide emotional support -antipsychotic agents: N/A 5. Neuropsych: This patientiscapable of making decisions on hisown behalf. 6. Skin/Wound Care:Routine skin checks 7. Fluids/Electrolytes/Nutrition:encourage PO     8.Hypertension. Norvasc 10 mg daily, lisinopril 20 mg daily. Monitor with increased mobility  -controlled 6/12 9. COPD/asthma. Continue nebulizers as directed 10.Primary immunodeficiency. Patient receives HYQVIA50 g every 28 days with next scheduled dose is today 05/04/2019. wife to administer 11. GERD. Protonix 12. Hyperlipidemia. Pravachol 13. Constipation. +bm yesterday  -senokot-s bid    14. Leukocytosis:  Up to 15.5k today  -ua neg, ucx pending, afebrile  -suspect d/t DVT's   LOS: 3 days A FACE TO FACE EVALUATION WAS PERFORMED  Meredith Staggers 05/04/2019, 10:29 AM

## 2019-05-04 NOTE — Progress Notes (Signed)
Occupational Therapy Session Note  Patient Details  Name: Patrick Waters MRN: [id number] Date of Birth: Sep 28, 1951  Today's Date: 05/04/2019 OT Individual Time: 6503-5465 OT Individual Time Calculation (min): 77 min    Short Term Goals: Week 1:  OT Short Term Goal 1 (Week 1): Pt will complete 2/3 toileting tasks with steadying assist OT Short Term Goal 2 (Week 1): Pt will don pants with steadying assist using AE PRN OT Short Term Goal 3 (Week 1): Pt will stand to complete 1 grooming task with steadying assist in order to increase functional standing endurance  Skilled Therapeutic Interventions/Progress Updates:    Pt completed transfer from supine to sit EOB with min assist.  Mod assist for donning TLSO secondary to time in order to complete toilet transfer.  Min assist for functional mobility and sit to stand for toilet transfer with mod assist for clothing management and toilet hygiene.  He removed his LB clothing with use of the reacher and supervision once he pushed it down during toileting.  Min assist for transfer from the 3:1 over the toilet to the shower seat.  Supervision for all UB bathing with min assist for LB.  He was given LH sponge for washing his feet and then used the reacher and a towel for drying them off.  He donned pullover shirt and brace sitting on the shower seat with min assist and then completed transfer back to the wheelchair at the sink with min assist. Reacher and sockaide utilized with min instructional cueing for LB dressing.  He needed min assist to pull his pants up with min assist again for sit to stand.  Finished session with pt in the wheelchair and call button and phone in reach and safety alarm belt in place.     Therapy Documentation Precautions:  Precautions Precautions: Fall Required Braces or Orthoses: Spinal Brace Spinal Brace: Lumbar corset, Applied in sitting position Restrictions Weight Bearing Restrictions: No   Vital Signs: Therapy  Vitals Pulse Rate: (!) 127(after mobility and adl) BP: 120/65 Patient Position (if appropriate): Sitting Oxygen Therapy SpO2: 92 % O2 Device: Room Air Pulse Oximetry Type: Intermittent Pain: Pain Assessment Pain Scale: 0-10 Pain Score: 7  Faces Pain Scale: Hurts a little bit Pain Type: Surgical pain Pain Location: Back Pain Orientation: Lower Pain Descriptors / Indicators: Discomfort Pain Frequency: Intermittent Pain Onset: With Activity Patients Stated Pain Goal: 2 Pain Intervention(s): Repositioned;Ambulation/increased activity ADL: See Care Tool Section for some details of ADL  Therapy/Group: Individual Therapy  Abygayle Deltoro OTR/L 05/04/2019, 9:31 AM

## 2019-05-04 NOTE — Progress Notes (Signed)
Physical Therapy Session Note  Patient Details  Name: Patrick Waters MRN: [id number] Date of Birth: 10-24-1951  Today's Date: 05/04/2019 PT Individual Time: 0746-0029 PT Individual Time Calculation (min): 10 min   Short Term Goals: Week 1:  PT Short Term Goal 1 (Week 1): Pt will complete bed mobility with min assist. PT Short Term Goal 2 (Week 1): Pt will ambulate 50 ft with LRAD & min assist. PT Short Term Goal 3 (Week 1): Pt will negotiate 2 steps without rails with LRAD & mod assist to simulate home entrance. PT Short Term Goal 4 (Week 1): Pt will complete all transfers with min assist & LRAD. PT Short Term Goal 5 (Week 1): Pt will propel w/c 50 ft with supervision.  Skilled Therapeutic Interventions/Progress Updates:   Pt received supine in bed with wife present and having infusion completed for auto immune deficiencies. PT encouraged pt to participate in bed level therapy. Provided pt's wife with home measurement sheet and incorporated therapeutic use of self to educate pt and Wife on rehab process and transition to home life upon d/c. Pt left in bed with call bell in reach and all needs met.       Therapy Documentation Precautions:  Precautions Precautions: Fall Required Braces or Orthoses: Spinal Brace Spinal Brace: Lumbar corset, Applied in sitting position Restrictions Weight Bearing Restrictions: No General: PT Amount of Missed Time (min): 65 Minutes PT Missed Treatment Reason: Other (Comment)(infusion) Vital Signs: Therapy Vitals Temp: 97.9 F (36.6 C) Temp Source: Oral Pulse Rate: (!) 102 Resp: 19 BP: (!) 88/55 Patient Position (if appropriate): Lying Oxygen Therapy SpO2: 93 % O2 Device: Room Air Pain: Pain Assessment Faces Pain Scale: Hurts little more    Therapy/Group: Individual Therapy  Lorie Phenix 05/04/2019, 5:33 PM

## 2019-05-04 NOTE — Progress Notes (Signed)
Occupational Therapy Session Note  Patient Details  Name: Patrick Waters MRN: [id number] Date of Birth: October 21, 1951  Today's Date: 05/04/2019 OT Individual Time: 8338-2505 OT Individual Time Calculation (min): 43 min    Short Term Goals: Week 1:  OT Short Term Goal 1 (Week 1): Pt will complete 2/3 toileting tasks with steadying assist OT Short Term Goal 2 (Week 1): Pt will don pants with steadying assist using AE PRN OT Short Term Goal 3 (Week 1): Pt will stand to complete 1 grooming task with steadying assist in order to increase functional standing endurance  Skilled Therapeutic Interventions/Progress Updates:    Pt seen for OT session focusing on upright tolerance and ADL re-training. Pt asleep in supine upon arrival, easily awoken and agreeable to tx session. Complaints of fatigue from previous sessions but willing to participate as able.  He transferred to sitting EOB with supervision using hospital bed functions. Pt tolerating ~5 minutes of sitting EOB with supervision. Complaints of nausea with mobility, RN made aware and administered medication during session.  He completed functional transfer and ~5 steps to w/c with min A using RW. VCs for hand placement on RW and technique with RW. Pt completed shaving task from w/c level at sink, education regarding importance of being very careful using standard razor as he is now on blood thinner and recommending use of electric razor at d/c for safety. Pt becoming SOB with w/c level ADLs. Reports he did not receive breathing tx this morning. Respiratory therapist alerted and administered breathing tx during session.  He ambulated ~63ft with RW with min A from doorway to recliner placed by bed. VCs for technique and extended rest break required following short distance ambulation.  Pt left seated up in recliner at end of session, set-up with meal tray and all needs in reach. Education provided regarding energy consrvation techniques 3:1 hospital stay  to recovery ratio, benefits of OOB and building upright tolerance, continuum of care and d/c planning.   Therapy Documentation Precautions:  Precautions Precautions: Fall, Back Required Braces or Orthoses: Spinal Brace Spinal Brace: Lumbar corset, Applied in sitting position(Can ambulate to bathroom and shower without brace) Restrictions Weight Bearing Restrictions: No   Therapy/Group: Individual Therapy  Patrick Waters L 05/04/2019, 7:05 AM

## 2019-05-04 NOTE — IPOC Note (Signed)
Overall Plan of Care [facility]) Patient Details Name: ISAMI MEHRA MRN: [id number] DOB: [birth year]  Admitting Diagnosis: <principal problem not specified>  Hospital Problems: Active Problems:   Lumbar radiculopathy     Functional Problem List: Nursing Bowel, Skin Integrity, Pain  PT Balance, Pain, Endurance, Safety, Motor, Sensory  OT Balance, Pain, Safety, Perception, Endurance, Sensory, Motor  SLP    TR         Basic ADL's: OT Grooming, Bathing, Dressing, Toileting     Advanced  ADL's: OT       Transfers: PT Bed Mobility, Car, Furniture, Bed to Education administrator, Tub/Shower     Locomotion: PT Ambulation, Emergency planning/management officer, Stairs     Additional Impairments: OT None  SLP        TR      Anticipated Outcomes Item Anticipated Outcome  Self Feeding Indep  Swallowing      Basic self-care  Set-up/supervision  Toileting  Supervision   Bathroom Transfers Supervision-min A  Bowel/Bladder  Bowel movements regualrly with medication  Transfers  supervision with LRAD  Locomotion  supervision gait with LRAD except min assist stairs without rails with LRAD  Communication     Cognition     Pain  Pain level<3.  Safety/Judgment  Patient does not fall while admitted.   Therapy Plan: PT Intensity: Minimum of 1-2 x/day ,45 to 90 minutes PT Frequency: 5 out of 7 days PT Duration Estimated Length of Stay: 2-2.5 weeks OT Intensity: Minimum of 1-2 x/day, 45 to 90 minutes OT Frequency: 5 out of 7 days OT Duration/Estimated Length of Stay: 2-2.5 weeks     Due to the current state of emergency, patients may not be receiving their 3-hours of Medicare-mandated therapy.   Team Interventions: Nursing Interventions Pain Management, Bowel Management, Skin Care/Wound Management  PT interventions Ambulation/gait training, Discharge planning, DME/adaptive equipment instruction, Functional mobility training, Pain management, Psychosocial support, Splinting/orthotics,  Therapeutic Activities, UE/LE Strength taining/ROM, Wheelchair propulsion/positioning, UE/LE Coordination activities, Therapeutic Exercise, Stair training, Skin care/wound management, Neuromuscular re-education, Patient/family education, Functional electrical stimulation, Disease management/prevention, Academic librarian, Training and development officer  OT Interventions Training and development officer, Community reintegration, Disease mangement/prevention, Technical sales engineer stimulation, Neuromuscular re-education, Barrister's clerk education, Self Care/advanced ADL retraining, Splinting/orthotics, Therapeutic Exercise, UE/LE Coordination activities, UE/LE Strength taining/ROM, Wheelchair propulsion/positioning, Therapeutic Activities, Skin care/wound managment, Psychosocial support, Pain management, Functional mobility training, DME/adaptive equipment instruction, Discharge planning  SLP Interventions    TR Interventions    SW/CM Interventions Discharge Planning, Psychosocial Support, Patient/Family Education   Barriers to Discharge MD  Medical stability  Nursing      PT      OT      SLP      SW       Team Discharge Planning: Destination: PT-Home ,OT- Home , SLP-  Projected Follow-up: PT-Home health PT, 24 hour supervision/assistance, OT-  Home health OT, SLP-  Projected Equipment Needs: PT-To be determined, OT- To be determined, SLP-  Equipment Details: PT- , OT-  Patient/family involved in discharge planning: PT- Patient,  OT-Patient, SLP-   MD ELOS: 14-17 days Medical Rehab Prognosis:  Excellent Assessment: The patient has been admitted for CIR therapies with the diagnosis of lumbar stenosis with radiculopathy. The team will be addressing functional mobility, strength, stamina, balance, safety, adaptive techniques and equipment, self-care, bowel and bladder mgt, patient and caregiver education, NMR, post-op precautions, pain mgt, community reentry. Goals have been set at supervision to  min assist for basic self-care and mobility.    Due to  the current state of emergency, patients may not be receiving their 3 hours per day of Medicare-mandated therapy.    Meredith Staggers, MD, FAAPMR      See Team Conference Notes for weekly updates to the plan of care

## 2019-05-04 NOTE — Plan of Care (Signed)
  Problem: Consults Goal: RH SPINAL CORD INJURY PATIENT EDUCATION Description:  See Patient Education module for education specifics.  Outcome: Progressing Goal: Skin Care Protocol Initiated - if Braden Score 18 or less Description: If consults are not indicated, leave blank or document N/A Outcome: Progressing Goal: Nutrition Consult-if indicated Outcome: Progressing   Problem: SCI BOWEL ELIMINATION Goal: RH STG MANAGE BOWEL WITH ASSISTANCE Description: STG Manage Bowel with Assistance. Outcome: Progressing Goal: RH STG SCI MANAGE BOWEL WITH MEDICATION WITH ASSISTANCE Description: STG SCI Manage bowel with medication with assistance. Outcome: Progressing Goal: RH STG MANAGE BOWEL W/EQUIPMENT W/ASSISTANCE Description: STG Manage Bowel With Equipment With Assistance Outcome: Progressing Goal: RH STG SCI MANAGE BOWEL PROGRAM W/ASSIST OR AS APPROPRIATE Description: STG SCI Manage bowel program w/assist or as appropriate. Outcome: Progressing Goal: RH OTHER STG BOWEL ELIMINATION GOALS W/ASSIST Description: Other STG Bowel Elimination Goals With Assistance. Outcome: Progressing   Problem: RH SKIN INTEGRITY Goal: RH STG SKIN FREE OF INFECTION/BREAKDOWN Outcome: Progressing Goal: RH STG MAINTAIN SKIN INTEGRITY WITH ASSISTANCE Description: STG Maintain Skin Integrity With Assistance. Outcome: Progressing   Problem: RH SAFETY Goal: RH STG ADHERE TO SAFETY PRECAUTIONS W/ASSISTANCE/DEVICE Description: STG Adhere to Safety Precautions With Assistance/Device. Outcome: Progressing   Problem: RH PAIN MANAGEMENT Goal: RH STG PAIN MANAGED AT OR BELOW PT'S PAIN GOAL Outcome: Progressing   Problem: RH KNOWLEDGE DEFICIT SCI Goal: RH STG INCREASE KNOWLEDGE OF SELF CARE AFTER SCI Outcome: Progressing

## 2019-05-04 NOTE — Progress Notes (Signed)
Social Work Assessment and Plan  Patient Details  Name: Patrick Waters MRN: [id number] Date of Birth: 23-Jun-1951  Today's Date: 05/04/2019  Problem List:  Patient Active Problem List   Diagnosis Date Noted  . Lumbar radiculopathy 05/01/2019  . Idiopathic scoliosis of thoracolumbar region 04/27/2019  . Spondylolisthesis of cervical region 02/14/2018   Past Medical History:  Past Medical History:  Diagnosis Date  . Arthritis   . Asthma   . COPD (chronic obstructive pulmonary disease) ([place])   . Dyspnea    with exertion  . Family history of adverse reaction to anesthesia    Daughter- N/V  . GERD (gastroesophageal reflux disease)   . Hypertension   . Pneumonia   . Sleep apnea    uses cpap   Past Surgical History:  Past Surgical History:  Procedure Laterality Date  . ANAL FISSURE REPAIR    . BACK SURGERY     lower back  . cervial fusion     x 2  . COLONOSCOPY    . Hemmorhoid    . POSTERIOR CERVICAL FUSION/FORAMINOTOMY N/A 02/14/2018   Procedure: Cervical six-Thoracic one Posterior decompression and fusion;  Surgeon: Erline Levine, MD;  Location: [facility];  Service: Neurosurgery;  Laterality: N/A;  . TRANSFORAMINAL LUMBAR INTERBODY FUSION (TLIF) WITH PEDICLE SCREW FIXATION 3 LEVEL Left 04/27/2019   Procedure: Left Lumbar two-three Lumbar three-four Lumbar four-five Transforaminal lumbar interbody fusion with redo decompression, pedicle screw fixation Lumbar two to Lumbar five;  Surgeon: Erline Levine, MD;  Location: [facility];  Service: Neurosurgery;  Laterality: Left;  Left Lumbar two-three Lumbar three-four Lumbar four-five Transforaminal lumbar interbody fusion with redo decompression, pedicle   Social History:  reports that he quit smoking about 33 years ago. He quit after 20.00 years of use. He has quit using smokeless tobacco. He reports previous alcohol use. He reports previous drug use.  Family / Support Systems Marital Status: Married How Long?: 50 years Patient Roles: Spouse,  Parent, Other (Comment)(grandfather) Spouse/Significant Other: Patrick Waters - wife - [phone] Children: 2 dtrs, 2 grandsons Anticipated Caregiver: pt's spouse, Patrick Waters Ability/Limitations of Caregiver: none Caregiver Availability: 24/7 Family Dynamics: close, supportive wife  Social History Preferred language: English Religion: Baptist Read: Yes Write: Yes Employment Status: Retired Date Retired/Disabled/Unemployed: 2017 Age Retired: 64 Public relations account executive Issues: none reported Guardian/Conservator: N/A - MD has determined that pt is capable of making his own decisions.   Abuse/Neglect Abuse/Neglect Assessment Can Be Completed: Yes Physical Abuse: Denies Verbal Abuse: Denies Sexual Abuse: Denies Exploitation of patient/patient's resources: Denies Self-Neglect: Denies  Emotional Status Pt's affect, behavior and adjustment status: Pt in good spirits, motivated to work with therapists.  Fatigued at the time of CSW's visit. Recent Psychosocial Issues: none reported Psychiatric History: none reported Substance Abuse History: none reported  Patient / Family Perceptions, Expectations & Goals Pt/Family understanding of illness & functional limitations: Pt/wife have a good understanding of pt's condition and limitations. Premorbid pt/family roles/activities: Pt likes to go to the beach with his wife. Anticipated changes in roles/activities/participation: Pt would like to resume activities as he is able. Pt/family expectations/goals: Pt/wife want pt to regain his indpendence and be able to go back to the beach as soon as he is physically able.  Community Resources [employer]: None Premorbid Home Care/DME Agencies: None Transportation available at discharge: wife  Discharge Planning Living Arrangements: Spouse/significant other Support Systems: Spouse/significant other, Children, Friends/neighbors Type of Residence: Private residence Insurance underwriter Resources:  [employer], Multimedia programmer (specify)(Medicare supplement (plan G)) [employer]  Resources: Radio broadcast assistant Screen Referred: No Money Management: Patient, Spouse Does the patient have any problems obtaining your medications?: No Home Management: Pt and wife usually share these responsibilities. Patient/Family Preliminary Plans: Pt plans to return home with his wife who can provide care as needed. Social Work Anticipated Follow Up Needs: HH/OP Expected length of stay: 2 to 2 1/2 weeks  Clinical Impression CSW met with pt and his wife to introduce self and role of CSW, as well as to complete assessment.  Pt's wife was in the hospital as she needed to administer pt's IV medication that he receives at home.  Pt and wife are supportive of one another and seemed happy to see each other.  Wife will be with pt at home 24/7.  Pt is overall coping well with everything, but was hopeful that he would not need to be here longer than a week and a half.  No current concerns/needs/questions.  CSW will continue to follow and assist as needed.   Gwenneth Whiteman, Silvestre Mesi 05/04/2019, 5:20 PM

## 2019-05-05 DIAGNOSIS — E785 Hyperlipidemia, unspecified: Secondary | ICD-10-CM

## 2019-05-05 DIAGNOSIS — M48062 Spinal stenosis, lumbar region with neurogenic claudication: Secondary | ICD-10-CM

## 2019-05-05 DIAGNOSIS — K219 Gastro-esophageal reflux disease without esophagitis: Secondary | ICD-10-CM

## 2019-05-05 NOTE — Progress Notes (Signed)
Patrick Waters is a 68 y.o. male 1951/03/03 [id number]  Subjective:  Slept well. Feeling OK. The patient noticed minor greenish sputum presence when he coughs starting yesterday.  There is no shortness of breath or wheezing.   Objective: Vital signs in last 24 hours: Temp:  [97.4 F (36.3 C)-98.7 F (37.1 C)] 98.7 F (37.1 C) (06/13 0359) Pulse Rate:  [97-111] 97 (06/13 0359) Resp:  [18-22] 20 (06/13 0359) BP: (88-115)/(55-65) 115/65 (06/13 0359) SpO2:  [85 %-93 %] 92 % (06/13 0838) Weight change:  Last BM Date: 05/03/19  Intake/Output from previous day: 06/12 0701 - 06/13 0700 In: 480 [P.O.:480] Out: -  Last cbgs: CBG (last 3)  No results for input(s): GLUCAP in the last 72 hours.   Physical Exam General: No apparent distress.  Obese HEENT: not dry Lungs: Normal effort. Lungs clear to auscultation, no crackles or wheezes. Cardiovascular: Regular rate and rhythm, no edema Abdomen: S/NT/ND; BS(+) Musculoskeletal:  unchanged Neurological: No new neurological deficits Wounds: Dressed.  Back brace    Skin: clear  Aging changes Mental state: Alert, oriented, cooperative    Lab Results: BMET    Component Value Date/Time   NA 130 (L) 05/02/2019 0610   K 3.9 05/02/2019 0610   CL 96 (L) 05/02/2019 0610   CO2 24 05/02/2019 0610   GLUCOSE 117 (H) 05/02/2019 0610   BUN 16 05/02/2019 0610   CREATININE 0.78 05/02/2019 0610   CALCIUM 8.1 (L) 05/02/2019 0610   GFRNONAA >60 05/02/2019 0610   GFRAA >60 05/02/2019 0610   CBC    Component Value Date/Time   WBC 15.5 (H) 05/04/2019 0728   RBC 4.26 05/04/2019 0728   HGB 13.0 05/04/2019 0728   HCT 38.0 (L) 05/04/2019 0728   PLT 238 05/04/2019 0728   MCV 89.2 05/04/2019 0728   MCH 30.5 05/04/2019 0728   MCHC 34.2 05/04/2019 0728   RDW 13.0 05/04/2019 0728   LYMPHSABS 1.7 05/02/2019 0610   MONOABS 1.2 (H) 05/02/2019 0610   EOSABS 0.1 05/02/2019 0610   BASOSABS 0.0 05/02/2019 0610    Studies/Results: No results  found.  Medications: I have reviewed the patient's current medications.  Assessment/Plan:  1.  Decreased functional mobility secondary to lumbar stenosis, spondylolisthesis with radiculopathy.  Status post L2-3, 3-4 and 4-5 lumbar interbody fusion with pedicle screw fixation on 04/27/2019.  Lumbar corset when out of bed.  Continue CIR 2.  DVT of the right leg veins currently on Xarelto 15 mg twice daily.  High risk for PE 3.  Pain management with gabapentin, oxycodone, Robaxin 4.  The patient is capable of making decisions on his own behalf 5.  Routine skin and wound care 6.  Hypertension.  On Norvasc and lisinopril  7.  COPD/asthma continue nebulizer treatments.  The patient is on Mucinex long-term.The patient noticed minor greenish sputum presence when he coughs starting yesterday.  There is no shortness of breath or wheezing.  It is minor according to him.  Will watch 8.  Primary immunodeficiency.  He receives HYQVIA 50 g every 20 days.  Next scheduled for 6/12 9.  GERD.  On Protonix 10.  Dyslipidemia on Pravachol 11.  Constipation.  On therapy 12.  Leukocytosis.  Monitoring  Length of stay, days: [place] , MD 05/05/2019, 11:23 AM

## 2019-05-05 NOTE — Progress Notes (Signed)
Subjective: Patient reports getting stronger  Objective: Vital signs in last 24 hours: Temp:  [97.4 F (36.3 C)-98.7 F (37.1 C)] 98.7 F (37.1 C) (06/13 0359) Pulse Rate:  [97-127] 97 (06/13 0359) Resp:  [18-22] 20 (06/13 0359) BP: (88-120)/(55-65) 115/65 (06/13 0359) SpO2:  [85 %-93 %] 91 % (06/13 0359)  Intake/Output from previous day: 06/12 0701 - 06/13 0700 In: 480 [P.O.:480] Out: -  Intake/Output this shift: No intake/output data recorded.  Physical Exam: Full PF/DF strength.  Leg pain improved.  Lab Results: Recent Labs    05/03/19 0630 05/04/19 0728  WBC 14.1* 15.5*  HGB 12.7* 13.0  HCT 36.0* 38.0*  PLT 189 238   BMET No results for input(s): NA, K, CL, CO2, GLUCOSE, BUN, CREATININE, CALCIUM in the last 72 hours.  Studies/Results: Vas [place]as Lower Extremity Venous (dvt)  Result Date: 05/03/2019  Lower Venous Study Indications: Swelling.  Risk Factors: Surgery: recent back surgery. Comparison Study: No prior study. Performing Technologist: Gertie FeyMichelle Simonetti MHA, RDMS, RVT, RDCS  Examination Guidelines: A complete evaluation includes B-mode imaging, spectral Doppler, color Doppler, and power Doppler as needed of all accessible portions of each vessel. Bilateral testing is considered an integral part of a complete examination. Limited examinations for reoccurring indications may be performed as noted.  +----------------+---------------+---------+-----------+----------+------------+ RIGHT           CompressibilityPhasicitySpontaneityPropertiesSummary      +----------------+---------------+---------+-----------+----------+------------+ CFV             Full           No       Yes                  pulsatile                                                                 flow         +----------------+---------------+---------+-----------+----------+------------+ SFJ             Full                                                       +----------------+---------------+---------+-----------+----------+------------+ FV Prox         Full                                                      +----------------+---------------+---------+-----------+----------+------------+ FV Mid          Partial                 Yes                  Acute        +----------------+---------------+---------+-----------+----------+------------+ FV Distal       Full                                                      +----------------+---------------+---------+-----------+----------+------------+  PFV             Full                                                      +----------------+---------------+---------+-----------+----------+------------+ POP             None           No       Yes                  Acute.                                                                    Pulsatile                                                                 flow         +----------------+---------------+---------+-----------+----------+------------+ PTV             Full                    Yes                               +----------------+---------------+---------+-----------+----------+------------+ PERO            Full                    Yes                               +----------------+---------------+---------+-----------+----------+------------+ Tibioperoneal   None           No       Yes                  Acute.       trunk                                                        Pulsatile                                                                 flow         +----------------+---------------+---------+-----------+----------+------------+   +---------+---------------+---------+-----------+----------+------------------+ LEFT     CompressibilityPhasicitySpontaneityPropertiesSummary            +---------+---------------+---------+-----------+----------+------------------+ CFV       Full  No       Yes                  Slightly pulsatile +---------+---------------+---------+-----------+----------+------------------+ SFJ      Full                                                            +---------+---------------+---------+-----------+----------+------------------+ FV Prox  Full                                                            +---------+---------------+---------+-----------+----------+------------------+ FV Mid   Full                                                            +---------+---------------+---------+-----------+----------+------------------+ FV DistalFull                                                            +---------+---------------+---------+-----------+----------+------------------+ PFV      Full                                                            +---------+---------------+---------+-----------+----------+------------------+ POP      Full           No       Yes                  Slightly pulsatile +---------+---------------+---------+-----------+----------+------------------+ PTV      Full                                                            +---------+---------------+---------+-----------+----------+------------------+ PERO     Full                                                            +---------+---------------+---------+-----------+----------+------------------+     Summary: Right: Findings consistent with acute deep vein thrombosis involving the right mid femoral vein, right popliteal vein, and right tibioperoneal trunk. No cystic structure found in the popliteal fossa. Left: There is no evidence of deep vein thrombosis in the lower extremity. No cystic structure found in the popliteal fossa.  *See table(s) above for measurements and observations. Electronically signed by Lemar LivingsBrandon Cain MD  on 05/03/2019 at 5:34:17 PM.    Final     Assessment/Plan: Patient is  progressing on Rehab.    LOS: 4 days    Peggyann Shoals, MD 05/05/2019, 8:41 AM

## 2019-05-05 NOTE — Plan of Care (Signed)
Alert and oriented X4. Able to communicate needs. Skin dry and warm. Honey comb drsg intact. No distress at this moment. Will continue monitoring.

## 2019-05-06 ENCOUNTER — Inpatient Hospital Stay (HOSPITAL_COMMUNITY): Payer: Medicare Other | Admitting: Occupational Therapy

## 2019-05-06 ENCOUNTER — Inpatient Hospital Stay (HOSPITAL_COMMUNITY): Payer: Medicare Other

## 2019-05-06 DIAGNOSIS — J209 Acute bronchitis, unspecified: Secondary | ICD-10-CM

## 2019-05-06 DIAGNOSIS — I270 Primary pulmonary hypertension: Secondary | ICD-10-CM

## 2019-05-06 DIAGNOSIS — J440 Chronic obstructive pulmonary disease with acute lower respiratory infection: Secondary | ICD-10-CM

## 2019-05-06 LAB — URINE CULTURE: Culture: >=100000 — AB

## 2019-05-06 MED ORDER — LEVOFLOXACIN 500 MG PO TABS
500.0000 mg | ORAL_TABLET | Freq: Every day | ORAL | Status: DC
  Administered 2019-05-06 – 2019-05-11 (×6): 500 mg via ORAL
  Filled 2019-05-06 (×6): qty 1

## 2019-05-06 MED ORDER — METHYLPREDNISOLONE 4 MG PO TBPK
8.0000 mg | ORAL_TABLET | Freq: Every morning | ORAL | Status: AC
  Administered 2019-05-06: 8 mg via ORAL
  Filled 2019-05-06: qty 21

## 2019-05-06 MED ORDER — METHYLPREDNISOLONE 4 MG PO TBPK
8.0000 mg | ORAL_TABLET | Freq: Every evening | ORAL | Status: AC
  Administered 2019-05-07: 8 mg via ORAL

## 2019-05-06 MED ORDER — METHYLPREDNISOLONE 4 MG PO TBPK
4.0000 mg | ORAL_TABLET | Freq: Three times a day (TID) | ORAL | Status: AC
  Administered 2019-05-07 (×3): 4 mg via ORAL

## 2019-05-06 MED ORDER — METHYLPREDNISOLONE 4 MG PO TBPK
8.0000 mg | ORAL_TABLET | Freq: Every evening | ORAL | Status: AC
  Administered 2019-05-06: 8 mg via ORAL

## 2019-05-06 MED ORDER — METHYLPREDNISOLONE 4 MG PO TBPK: 4.0000 mg | ORAL_TABLET | Freq: Four times a day (QID) | ORAL | Status: DC

## 2019-05-06 MED ORDER — METHYLPREDNISOLONE 4 MG PO TBPK
4.0000 mg | ORAL_TABLET | ORAL | Status: AC
  Administered 2019-05-06: 4 mg via ORAL

## 2019-05-06 NOTE — Progress Notes (Signed)
Occupational Therapy Session Note  Patient Details  Name: Patrick Waters MRN: [id number] Date of Birth: 04-19-1951  Today's Date: 05/06/2019 OT Individual Time: 8938-1017 OT Individual Time Calculation (min): 54 min    Short Term Goals: Week 1:  OT Short Term Goal 1 (Week 1): Pt will complete 2/3 toileting tasks with steadying assist OT Short Term Goal 2 (Week 1): Pt will don pants with steadying assist using AE PRN OT Short Term Goal 3 (Week 1): Pt will stand to complete 1 grooming task with steadying assist in order to increase functional standing endurance  Skilled Therapeutic Interventions/Progress Updates:    Pt seen for OT ADL bathing/dressing session. Pt awake in supine upon arrival, agreeable to tx session and desiring to take a shower. He transferred to sitting EOB with supervision using hospital bed functions, independently initiating log rolling technqiue.  He ambulated into bathroom with min A using RW with VCs for safety and RW management in functional context. He completed toileting task with assist for buttock hygiene and steadying assist while pt manipulated pants. He transitioned into shower and bathed seated on shower chair using LH sponge to assist with washing LEs. He stood with use of grab bar and min A with assist required to complete thorough buttock hygiene as pt unable to reach.  He transferred back to toilet and dressed seated on toilet. Used reacher to assist with threading LEs into pants and stood with steadying assist to pull pants up. UB dressing completed with set-up and assist to don lumbar corset. Pt knocked hand on toilet paper cannister causing skin tear. RN made aware and applied bandage. He ambulated out of bathroom to recliner with min A. Pt left seated in recliner at end of session, all needs in reach. Throughout session, pt required increased time and extended rest breaks to complete all tasks 2/2 decreased functional activity tolerance. Education provided  throughout regarding energy conservation techniques, modified ADLs, and d/c planning.    Therapy Documentation Precautions:  Precautions Precautions: Fall Required Braces or Orthoses: Spinal Brace Spinal Brace: Lumbar corset, Applied in sitting position Restrictions Weight Bearing Restrictions: No   Therapy/Group: Individual Therapy  Belissa Kooy L 05/06/2019, 10:44 AM

## 2019-05-06 NOTE — Progress Notes (Signed)
Patrick Waters is a 68 y.o. male May 17, 1951 [id number]  Subjective: The patient continues to complain of cough productive for purulent sputum of 2-3 days duration, sore throat.  He had a couple episodes when he felt short of breath.  He is worried.  In the past he would receive an antibiotic like Ceftin or Levaquin and a low-dose prednisone for the type of symptoms.  No fever  Objective: Vital signs in last 24 hours: Temp:  [97.8 F (36.6 C)-98.1 F (36.7 C)] 97.8 F (36.6 C) (06/14 0419) Pulse Rate:  [98-101] 98 (06/14 0852) Resp:  [19-20] 20 (06/14 0852) BP: (91-121)/(51-71) 121/71 (06/14 0419) SpO2:  [75 %-94 %] 93 % (06/14 0856) Weight change:  Last BM Date: 05/03/19  Intake/Output from previous day: 06/13 0701 - 06/14 0700 In: 1740 [P.O.:1740] Out: 950 [Urine:950] Last cbgs: CBG (last 3)  No results for input(s): GLUCAP in the last 72 hours.   Physical Exam General: No apparent distress.  He is obese HEENT: not dry.  No throat erythema or thrush Lungs: Normal effort. Lungs clear to auscultation, no crackles or wheezes.  His sputum looks thick and gray Cardiovascular: Regular rate and rhythm, no edema Abdomen: S/NT/ND; BS(+) Musculoskeletal:  unchanged Neurological: No new neurological deficits Wounds: N/A    Skin: clear  Aging changes Mental state: Alert, oriented, cooperative    Lab Results: BMET    Component Value Date/Time   NA 130 (L) 05/02/2019 0610   K 3.9 05/02/2019 0610   CL 96 (L) 05/02/2019 0610   CO2 24 05/02/2019 0610   GLUCOSE 117 (H) 05/02/2019 0610   BUN 16 05/02/2019 0610   CREATININE 0.78 05/02/2019 0610   CALCIUM 8.1 (L) 05/02/2019 0610   GFRNONAA >60 05/02/2019 0610   GFRAA >60 05/02/2019 0610   CBC    Component Value Date/Time   WBC 15.5 (H) 05/04/2019 0728   RBC 4.26 05/04/2019 0728   HGB 13.0 05/04/2019 0728   HCT 38.0 (L) 05/04/2019 0728   PLT 238 05/04/2019 0728   MCV 89.2 05/04/2019 0728   MCH 30.5 05/04/2019 0728   MCHC  34.2 05/04/2019 0728   RDW 13.0 05/04/2019 0728   LYMPHSABS 1.7 05/02/2019 0610   MONOABS 1.2 (H) 05/02/2019 0610   EOSABS 0.1 05/02/2019 0610   BASOSABS 0.0 05/02/2019 0610    Studies/Results: Dg Chest 2 View  Result Date: 05/06/2019 CLINICAL DATA:  Greenish sputum.  Coughing. EXAM: CHEST - 2 VIEW COMPARISON:  None. FINDINGS: The heart size and mediastinal contours are within normal limits. Both lungs are clear. The visualized skeletal structures are unremarkable. IMPRESSION: No active cardiopulmonary disease. Electronically Signed   By: Dorise Bullion III M.D   On: 05/06/2019 08:49    Medications: I have reviewed the patient's current medications.  Assessment/Plan:   1.  Decreased functional mobility secondary to lumbar stenosis, spondylolisthesis with radiculopathy.  Status post L2-3, 3-4 and 4-5 lumbar interbody fusion with pedicle screw fixation on 04/27/2019.  Lumbar corset when out of bed.  Continue CIR 2.  DVT of the right leg veins, on Xarelto 50 mg twice a day.  High risk for PE if left without anticoagulation 3.  Pain management with gabapentin, oxycodone, Robaxin as needed 4.  The patient is capable of making decisions on his own behalf 5.  Routine skin and wound care 6.  Hypertension.  Continue Norvasc and lisinopril.  The patient does not report previous cough symptoms with lisinopril, however, may consider switching to ARB instead 7.  COPD/asthma -worse.  Continue with nebulized treatments.  Continue Mucinex.  He seems to be having more symptoms.  I will start him on Medrol Dosepak.  May consider stopping Dosepak early if clinically improved 8.  Primary immune deficiency.  He receives HYQVIA 50 g every 20 days.  Next scheduled for 6/12 9.  GERD.  Continue Protonix 10.  Dyslipidemia.  Continue Pravachol 11.  Constipation.  Continue with PRN laxatives 12.  Leukocytosis.  Continue to monitor however it may get worse on steroids 13.  Probable acute bronchitis.  We obtained a  chest x-Brandy today.  There was no pneumonia.  In the view he is immune deficiency, will start on Levaquin p.o.     Length of stay, days: 5  Sonda PrimesAlex Myer Bohlman , MD 05/06/2019, 12:20 PM

## 2019-05-06 NOTE — Progress Notes (Signed)
Slept well throughout the night. Medicated x1 for c/o back pain-effective.

## 2019-05-06 NOTE — Progress Notes (Signed)
Occupational Therapy Session Note  Patient Details  Name: Patrick Waters MRN: [id number] Date of Birth: [birth year]  Today's Date: 05/06/2019 OT Individual Time: 0623-7628 OT Individual Time Calculation (min): 84 min    Short Term Goals: Week 1:  OT Short Term Goal 1 (Week 1): Pt will complete 2/3 toileting tasks with steadying assist OT Short Term Goal 2 (Week 1): Pt will don pants with steadying assist using AE PRN OT Short Term Goal 3 (Week 1): Pt will stand to complete 1 grooming task with steadying assist in order to increase functional standing endurance  Skilled Therapeutic Interventions/Progress Updates:   Pt received supine with c/o back pain 4/10 described as soreness, no request for intervention. Pt completed log rolling technique to EOB with use of bed features, (S). Min A provided to don back brace EOB. Cueing required for UE placement during sit > stand, CGA provided. Pt used RW to complete ambulatory transfer to sink with CGA. Cueing requried for activity pacing and energy conservation, as pt getting anxious and attempting to complete transfer too fast. Pt sat at sink and completed oral care. Edu provided re strategies to complete oral care at home without breaking back precautions by leaning over sink. Pt was transported to therapy gym. Pt completed 15 ft of functional mobility with RW to therapy mat from w/c. Pt required multiple cues for hand placement during transfers, especially during stand > sit. Activity from mat focused on functional standing tolerance. Pt able to stand x5 trials and remain standing for 1-2 minutes with intermittent unilateral UE release from RW. Pt with increased SOB following each trial and requiring increased rest break. Vitals monitored, with Spo2 90-92% following activity and HR 115-120 bpm. Pt was then brought to ADL apartment where demo and edu was provided re walk in shower transfer and potential types of DME to assist. Pt asking appropriate questions and  engaged throughout.  Pt then reported needing to use bathroom and was transported back to room. Pt completed ambulatory transfer to [facility] over toilet with cueing again for activity pacing. Pt reported bowel incontinence in pants. Incontinence brief was obtained and pt donned in standing. Pt required mod A for posterior peri hygiene thoroughly. Pt returned to EOB with cueing required to slow down pace and for breathing technique. Pt was left supine with all needs within reach and bed alarm set.   Therapy Documentation Precautions:  Precautions Precautions: Fall Required Braces or Orthoses: Spinal Brace Spinal Brace: Lumbar corset, Applied in sitting position Restrictions Weight Bearing Restrictions: No   Therapy/Group: Individual Therapy  Curtis Sites 05/06/2019, 7:27 AM

## 2019-05-06 NOTE — Progress Notes (Signed)
Physical Therapy Session Note  Patient Details  Name: Patrick Waters MRN: [id number] Date of Birth: 08/31/1951  Today's Date: 05/06/2019 PT Individual Time: 1015-1105 PT Individual Time Calculation (min): 50 min  and Today's Date: 05/06/2019 PT Missed Time: 10 Minutes Missed Time Reason: Patient ill (Comment);Patient fatigue(light headed, low BP)  Short Term Goals: Week 1:  PT Short Term Goal 1 (Week 1): Pt will complete bed mobility with min assist. PT Short Term Goal 2 (Week 1): Pt will ambulate 50 ft with LRAD & min assist. PT Short Term Goal 3 (Week 1): Pt will negotiate 2 steps without rails with LRAD & mod assist to simulate home entrance. PT Short Term Goal 4 (Week 1): Pt will complete all transfers with min assist & LRAD. PT Short Term Goal 5 (Week 1): Pt will propel w/c 50 ft with supervision.  Skilled Therapeutic Interventions/Progress Updates:    Pt supine in bed upon PT arrival, agreeable to therapy tx and reports pain 3/10 in low back/hips. Pt transferred to sitting EOB with supervision, therapist assisted pt to don shoes with min assist. Pt performed stand pivot with RW and min assist from bed>w/c. Pt transported to the gym. Pt performed sit<>stand from w/c with min assist and ambulated x 10 ft to the mat with RW and min assist. Pt appears to be SOB this session, requiring longer seated rest breaks and cues for breathing techniques, SpO2 90% on RA following activity. Pt worked on standing balance and activity tolerance to performed 2 x 10 toe taps on aerobic step with RW for UE support. Pt worked on standing balance with single UE support on RW while tossing horseshoes, x2 trials with CGA for balance. Pt ambulated x 30 ft with RW and min assist, working on activity tolerance and gait, SpO2 91% following activity with increased work of breathing noted. Pt reports feeling weak, fatigued and lightheaded this session, therapist checked vitals: BP 92/69 and HR 58 bpm. Pt missed 10 minutes of  skilled therapy tx this session secondary to fatigue and lightheadedness, RN also notified of symptoms.   Therapy Documentation Precautions:  Precautions Precautions: Fall Required Braces or Orthoses: Spinal Brace Spinal Brace: Lumbar corset, Applied in sitting position Restrictions Weight Bearing Restrictions: No    Therapy/Group: Individual Therapy  Netta Corrigan, PT, DPT 05/06/2019, 9:29 AM

## 2019-05-07 ENCOUNTER — Inpatient Hospital Stay (HOSPITAL_COMMUNITY): Payer: Medicare Other | Admitting: Physical Therapy

## 2019-05-07 ENCOUNTER — Inpatient Hospital Stay (HOSPITAL_COMMUNITY): Payer: Medicare Other | Admitting: Occupational Therapy

## 2019-05-07 LAB — CBC
HCT: 34.1 % — ABNORMAL LOW (ref 39.0–52.0)
Hemoglobin: 11.8 g/dL — ABNORMAL LOW (ref 13.0–17.0)
MCH: 30.6 pg (ref 26.0–34.0)
MCHC: 34.6 g/dL (ref 30.0–36.0)
MCV: 88.3 fL (ref 80.0–100.0)
Platelets: 420 10*3/uL — ABNORMAL HIGH (ref 150–400)
RBC: 3.86 MIL/uL — ABNORMAL LOW (ref 4.22–5.81)
RDW: 12.9 % (ref 11.5–15.5)
WBC: 10.3 10*3/uL (ref 4.0–10.5)
nRBC: 0 % (ref 0.0–0.2)

## 2019-05-07 NOTE — Progress Notes (Signed)
Physical Therapy Session Note  Patient Details  Name: Patrick Waters MRN: [id number] Date of Birth: Mar 10, 1951  Today's Date: 05/07/2019 PT Individual Time: 1100-1200; 1415-1530 PT Individual Time Calculation (min): 60 min and 75 min  Short Term Goals: Week 1:  PT Short Term Goal 1 (Week 1): Pt will complete bed mobility with min assist. PT Short Term Goal 2 (Week 1): Pt will ambulate 50 ft with LRAD & min assist. PT Short Term Goal 3 (Week 1): Pt will negotiate 2 steps without rails with LRAD & mod assist to simulate home entrance. PT Short Term Goal 4 (Week 1): Pt will complete all transfers with min assist & LRAD. PT Short Term Goal 5 (Week 1): Pt will propel w/c 50 ft with supervision.  Skilled Therapeutic Interventions/Progress Updates:    Session 1: Pt received seated in w/c in room, agreeable to PT session. Pt reports 5/10 pain in back at rest, declines intervention. Sit to stand with min A to RW. Ambulation x 90 ft with RW and min A. Pt reports feeling short of breath and lightheaded following gait. Seated SpO2 98%, BP 103/63. Lightheadedness improves with seated rest break. Standing BP 114/57 with slight increase in symptoms in standing. Nustep level 2 x 10 min with use of B UE/LE for global endurance training. Ambulation x 90 ft with RW and min A. Pt reports feeling short of breath but no symptoms of lightheadness with last bout of ambulation. Pt left seated in w/c in room with needs in reach at end of session.  Session 2: Pt received semi-reclined in bed, agreeable to PT session. No complaints of pain. Semi-reclined to sitting EOB with CGA via logroll technique. Pt is setup A to don LSO while seated EOB, min A for shoes. Stand pivot transfer bed to w/c with RW and min A. Ascend/descend 4 6" stairs with 2 handrails and mod A, standing rest break at top of stairs due to fatigue. Ascend/descend one 6" step with one handrail and mod A with increased time needed to complete due to decreased  UE support. Ambulation 2 x 100 ft with RW and min A, heavy reliance with BUE noted, v/c to decrease UE support. Sitting to semi-reclined on therapy wedge with min A. Semi-reclined BLE strengthening therex x 10 reps: heel slides, hip abd, SAQ, glute sets. Semi-reclined to sitting EOM with mod A. Stand pivot transfer back to w/c with RW and min A. Assisted pt back to bed with mod A for BLE management. Pt left semi-reclined in bed with needs in reach at end of session.   Therapy Documentation Precautions:  Precautions Precautions: Fall Required Braces or Orthoses: Spinal Brace Spinal Brace: Lumbar corset, Applied in sitting position Restrictions Weight Bearing Restrictions: No    Therapy/Group: Individual Therapy   Excell Seltzer, PT, DPT  05/07/2019, 12:54 PM

## 2019-05-07 NOTE — Progress Notes (Addendum)
[place] PHYSICAL MEDICINE & REHABILITATION PROGRESS NOTE   Subjective/Complaints: In bed. No new complaints except for bed being uncomfortable  ROS: Patient denies fever, rash, sore throat, blurred vision, nausea, vomiting, diarrhea, cough, shortness of breath or chest pain  headache, or mood change.    .    Objective:   Dg Chest 2 View  Result Date: 05/06/2019 CLINICAL DATA:  Greenish sputum.  Coughing. EXAM: CHEST - 2 VIEW COMPARISON:  None. FINDINGS: The heart size and mediastinal contours are within normal limits. Both lungs are clear. The visualized skeletal structures are unremarkable. IMPRESSION: No active cardiopulmonary disease. Electronically Signed   By: Gerome Samavid  Williams III M.D   On: 05/06/2019 08:49   Recent Labs    05/07/19 0746  WBC 10.3  HGB 11.8*  HCT 34.1*  PLT 420*   No results for input(s): NA, K, CL, CO2, GLUCOSE, BUN, CREATININE, CALCIUM in the last 72 hours.  Intake/Output Summary (Last 24 hours) at 05/07/2019 0857 Last data filed at 05/07/2019 0835 Gross per 24 hour  Intake 573 ml  Output -  Net 573 ml     Physical Exam: Vital Signs Blood pressure 117/74, pulse 90, temperature 97.6 F (36.4 C), resp. rate 18, height 5\' 10"  (1.778 m), weight 105.5 kg, SpO2 95 %. Constitutional: No distress . Vital signs reviewed. HEENT: EOMI, oral membranes moist Neck: supple Cardiovascular: RRR without murmur. No JVD    Respiratory: CTA Bilaterally without wheezes or rales. Normal effort    GI: BS +, non-tender, non-distended  Musculoskeletal:  General: Edema: LE edema. Neurological: He is alertand oriented to person, place, and time. Nocranial nerve deficit. Patient is alert in no acute distress. Follows full commands.UE 4/5. LE 3/5 HF, KE and 4-/5 ADF/PF. No focal sensory changes. exam stable Skin: Skin iswarm. Back incision CDI with honeycomb Psychiatric: pleasant     Assessment/Plan: 1. Functional deficits secondary to lumbar  stenosis with radiculopathy which require 3+ hours per day of interdisciplinary therapy in a comprehensive inpatient rehab setting.  Physiatrist is providing close team supervision and 24 hour management of active medical problems listed below.  Physiatrist and rehab team continue to assess barriers to discharge/monitor patient progress toward functional and medical goals  Care Tool:  Bathing    Body parts bathed by patient: Right arm, Left arm, Chest, Abdomen, Front perineal area, Right upper leg, Left upper leg, Right lower leg, Left lower leg, Face   Body parts bathed by helper: Buttocks     Bathing assist Assist Level: Minimal Assistance - Patient > 75%(LH Sponge)     Upper Body Dressing/Undressing Upper body dressing   What is the patient wearing?: Pull over shirt, Orthosis Orthosis activity level: Performed by helper  Upper body assist Assist Level: Supervision/Verbal cueing    Lower Body Dressing/Undressing Lower body dressing      What is the patient wearing?: Pants     Lower body assist Assist for lower body dressing: Contact Guard/Touching assist(Reacher)     Toileting Toileting    Toileting assist Assist for toileting: Moderate Assistance - Patient 50 - 74% Assistive Device Comment: (Urinal)   Transfers Chair/bed transfer  Transfers assist  Chair/bed transfer activity did not occur: Safety/medical concerns  Chair/bed transfer assist level: Minimal Assistance - Patient > 75%     Locomotion Ambulation   Ambulation assist   Ambulation activity did not occur: Safety/medical concerns  Assist level: Minimal Assistance - Patient > 75% Assistive device: Walker-rolling Max distance: 30 ft   Walk  10 feet activity   Assist  Walk 10 feet activity did not occur: Safety/medical concerns  Assist level: Minimal Assistance - Patient > 75% Assistive device: Walker-rolling   Walk 50 feet activity   Assist Walk 50 feet with 2 turns activity did not  occur: Safety/medical concerns         Walk 150 feet activity   Assist Walk 150 feet activity did not occur: Safety/medical concerns         Walk 10 feet on uneven surface  activity   Assist Walk 10 feet on uneven surfaces activity did not occur: Safety/medical concerns         Wheelchair     Assist Will patient use wheelchair at discharge?: Yes Type of Wheelchair: Manual Wheelchair activity did not occur: Safety/medical concerns         Wheelchair 50 feet with 2 turns activity    Assist    Wheelchair 50 feet with 2 turns activity did not occur: Safety/medical concerns       Wheelchair 150 feet activity     Assist Wheelchair 150 feet activity did not occur: Safety/medical concerns         Medical Problem List and Plan: 1.Decreased functional mobilitysecondary to lumbar stenosis, spondylolisthesis with radiculopathy. Status post L2-3, 3-4 and 4-5 lumbar interbody fusion with pedicle screw fixation 04/27/2019. Lumbar corset when out of bed --Continue CIR therapies including PT, OT  2. Antithrombotics: -pt with  DVT's of right mid femoral vein, right popliteal, right tib-peronal  -xarelto 15mg  bid to 20mg  daily, NS aware -antiplatelet therapy: N/A 3. Pain Management:Neurontin 300 mg 3 times daily,oxycodoneand Robaxin as needed  4. Mood:Provide emotional support -antipsychotic agents: N/A 5. Neuropsych: This patientiscapable of making decisions on hisown behalf. 6. Skin/Wound Care:Routine skin checks 7. Fluids/Electrolytes/Nutrition:encourage PO     8.Hypertension. Norvasc 10 mg daily, lisinopril 20 mg daily. Monitor with increased mobility  -controlled 6/15 9. COPD/asthma. Continue nebulizers as directed 10.Primary immunodeficiency. Patient receivec HYQVIA50 g every 28 days with next scheduled dose was 05/04/2019. wife administered without any problems 11. GERD. Protonix 12.  Hyperlipidemia. Pravachol  13. Constipation. Moving bowels  -senokot-s bid 14. Leukocytosis:  Down to 10.3 today  -had productive cough over weekend, cxr unremarkable  -levaquin, medrol dose pack  -ua neg, ucx pending, afebrile  -suspect d/t DVT's as well  LOS: 6 days A FACE TO FACE EVALUATION WAS PERFORMED  Meredith Staggers 05/07/2019, 8:57 AM

## 2019-05-07 NOTE — Progress Notes (Signed)
Occupational Therapy Session Note  Patient Details  Name: Patrick Waters MRN: [id number] Date of Birth: [birth year]  Today's Date: 05/07/2019 OT Individual Time: 8841-6606 OT Individual Time Calculation (min): 64 min    Short Term Goals: Week 1:  OT Short Term Goal 1 (Week 1): Pt will complete 2/3 toileting tasks with steadying assist OT Short Term Goal 2 (Week 1): Pt will don pants with steadying assist using AE PRN OT Short Term Goal 3 (Week 1): Pt will stand to complete 1 grooming task with steadying assist in order to increase functional standing endurance  Skilled Therapeutic Interventions/Progress Updates:    Pt completed transfer from supine to sit EOB with supervision.  He then donned the TLSO with supervision and min instructional cueing for orientation.  Toilet transfers completed from the bed to the toilet with min assist and use of the RW.  HR increased to 113 post transfer and O2 sats at 92% on room air.  Min assist for managing clothing and brief in order for pt to urinate.  He then transferred out to the wheelchair at the sink with min assist for grooming tasks and for washing and dressing his UB.  Pt declined shower this session and just wanted to wash off a little bit.  He completed oral hygiene, washing his UB, shaving, and donning UB clothing and TLSO with supervision.  He then was taken down to the ADL apartment where he completed simulated walk-in shower transfers with min assist.  Pt has built in shower seat that he thinks will be OK.  If not therapist discussed using the 3:1 in the shower as well.  He then returned to the room via wheelchair and therapist assist to complete session.  Pt left with call button in reach and safety belt in place.    Therapy Documentation Precautions:  Precautions Precautions: Fall Required Braces or Orthoses: Spinal Brace Spinal Brace: Lumbar corset, Applied in sitting position Restrictions Weight Bearing Restrictions: No  Pain: Pain  Assessment Pain Scale: Faces Pain Score: 3  Faces Pain Scale: Hurts a little bit Pain Type: Surgical pain Pain Location: Back Pain Orientation: Lower Pain Descriptors / Indicators: Discomfort Pain Frequency: Intermittent Pain Onset: With Activity Patients Stated Pain Goal: 0 Pain Intervention(s): Repositioned Multiple Pain Sites: No ADL: See Care Tool section for some details of ADL  Therapy/Group: Individual Therapy  Mal Asher OTR/L 05/07/2019, 11:08 AM

## 2019-05-07 NOTE — Discharge Instructions (Addendum)
Inpatient Rehab Discharge Instructions  Patrick SchatzRonnie D Waters Discharge date and time: No discharge date for patient encounter.   Activities/Precautions/ Functional Status: Activity: Lumbar corset when out of bed Diet: regular diet Wound Care: keep wound clean and dry Functional status:  ___ No restrictions     ___ Walk up steps independently ___ 24/7 supervision/assistance   ___ Walk up steps with assistance ___ Intermittent supervision/assistance  ___ Bathe/dress independently ___ Walk with walker     _x__ Bathe/dress with assistance ___ Walk Independently    ___ Shower independently ___ Walk with assistance    ___ Shower with assistance ___ No alcohol     ___ Return to work/school ________  COMMUNITY REFERRALS UPON DISCHARGE:   Home Health:   PT     OT     RN   [facility] Phone:  [phone] Medical Equipment/Items Ordered:  Wide rolling walker; bedside commode  (Your wife already ordered a shower seat.)  Agency/Supplier:  [facility]       Phone:  [phone]  Special Instructions: No smoking driving or alcohol   My questions have been answered and I understand these instructions. I will adhere to these goals and the provided educational materials after my discharge from the hospital.  Patient/Caregiver Signature _______________________________ Date __________  Clinician Signature _______________________________________ Date __________  Please bring this form and your medication list with you to all your follow-up doctor's appointments.   Information on my medicine - XARELTO (rivaroxaban)  This medication education was reviewed with me or my healthcare representative as part of my discharge preparation.  The pharmacist that spoke with me during my hospital stay was:  Patrick BellowsGreen, Patrick L, [facility]  WHY WAS XARELTO PRESCRIBED FOR YOU? Xarelto was prescribed to treat blood clots that may have been found in the veins of your legs (deep vein thrombosis) or in your lungs  (pulmonary embolism) and to reduce the risk of them occurring again.  What do you need to know about Xarelto? The starting dose is one 15 mg tablet taken TWICE daily with food for the FIRST 21 DAYS then on (enter date)  7/3  the dose is changed to one 20 mg tablet taken ONCE A DAY with your evening meal.  DO NOT stop taking Xarelto without talking to the health care provider who prescribed the medication.  Refill your prescription for 20 mg tablets before you run out.  After discharge, you should have regular check-up appointments with your healthcare provider that is prescribing your Xarelto.  In the future your dose may need to be changed if your kidney function changes by a significant amount.  What do you do if you miss a dose? If you are taking Xarelto TWICE DAILY and you miss a dose, take it as soon as you remember. You may take two 15 mg tablets (total 30 mg) at the same time then resume your regularly scheduled 15 mg twice daily the next day.  If you are taking Xarelto ONCE DAILY and you miss a dose, take it as soon as you remember on the same day then continue your regularly scheduled once daily regimen the next day. Do not take two doses of Xarelto at the same time.   Important Safety Information Xarelto is a blood thinner medicine that can cause bleeding. You should call your healthcare provider right away if you experience any of the following: ? Bleeding from an injury or your nose that does not stop. ? Unusual colored urine (  red or dark brown) or unusual colored stools (red or black). ? Unusual bruising for unknown reasons. ? A serious fall or if you hit your head (even if there is no bleeding).  Some medicines may interact with Xarelto and might increase your risk of bleeding while on Xarelto. To help avoid this, consult your healthcare provider or pharmacist prior to using any new prescription or non-prescription medications, including herbals, vitamins, non-steroidal  anti-inflammatory drugs (NSAIDs) and supplements.  This website has more information on Xarelto: [email or web address]

## 2019-05-08 ENCOUNTER — Inpatient Hospital Stay (HOSPITAL_COMMUNITY): Payer: Medicare Other | Admitting: Physical Therapy

## 2019-05-08 ENCOUNTER — Inpatient Hospital Stay (HOSPITAL_COMMUNITY): Payer: Medicare Other | Admitting: Occupational Therapy

## 2019-05-08 NOTE — Plan of Care (Signed)
  Problem: Consults Goal: RH SPINAL CORD INJURY PATIENT EDUCATION Description:  See Patient Education module for education specifics.  Outcome: Progressing Goal: Skin Care Protocol Initiated - if Braden Score 18 or less Description: If consults are not indicated, leave blank or document N/A Outcome: Progressing Goal: Nutrition Consult-if indicated Outcome: Progressing   Problem: SCI BOWEL ELIMINATION Goal: RH STG MANAGE BOWEL WITH ASSISTANCE Description: STG Manage Bowel with mod Assistance. Outcome: Progressing Goal: RH STG SCI MANAGE BOWEL WITH MEDICATION WITH ASSISTANCE Description: STG SCI Manage bowel with medication with assistance. Outcome: Progressing Goal: RH STG MANAGE BOWEL W/EQUIPMENT W/ASSISTANCE Description: STG Manage Bowel With Equipment With  mod Assistance Outcome: Progressing Goal: RH STG SCI MANAGE BOWEL PROGRAM W/ASSIST OR AS APPROPRIATE Description: STG SCI Manage bowel program w/ mod assist or as appropriate. Outcome: Progressing Goal: RH OTHER STG BOWEL ELIMINATION GOALS W/ASSIST Description: Other STG Bowel Elimination Goals With min  Assistance. Outcome: Progressing   Problem: RH SKIN INTEGRITY Goal: RH STG SKIN FREE OF INFECTION/BREAKDOWN Description: Skin free from infection entire stay on rehab Outcome: Progressing Goal: RH STG MAINTAIN SKIN INTEGRITY WITH ASSISTANCE Description: STG Maintain Skin Integrity With Assistance. Outcome: Progressing   Problem: RH SAFETY Goal: RH STG ADHERE TO SAFETY PRECAUTIONS W/ASSISTANCE/DEVICE Description: STG Adhere to Safety Precautions With  mod Assistance/Device. Outcome: Progressing   Problem: RH PAIN MANAGEMENT Goal: RH STG PAIN MANAGED AT OR BELOW PT'S PAIN GOAL Description: Pain less than 2  Outcome: Progressing   Problem: RH KNOWLEDGE DEFICIT SCI Goal: RH STG INCREASE KNOWLEDGE OF SELF CARE AFTER SCI Description: Patint able to increase knowledge mod assist self care after sci Outcome:  Progressing

## 2019-05-08 NOTE — Progress Notes (Signed)
[place] PHYSICAL MEDICINE & REHABILITATION PROGRESS NOTE   Subjective/Complaints: Up at EOB. A little sore. No new issues. Breathing well. Occasional cough  ROS: Patient denies fever, rash, sore throat, blurred vision, nausea, vomiting, diarrhea, cough, shortness of breath or chest pain,  headache, or mood change. .    .    Objective:   Dg Chest 2 View  Result Date: 05/06/2019 CLINICAL DATA:  Greenish sputum.  Coughing. EXAM: CHEST - 2 VIEW COMPARISON:  None. FINDINGS: The heart size and mediastinal contours are within normal limits. Both lungs are clear. The visualized skeletal structures are unremarkable. IMPRESSION: No active cardiopulmonary disease. Electronically Signed   By: Dorise Bullion III M.D   On: 05/06/2019 08:49   Recent Labs    05/07/19 0746  WBC 10.3  HGB 11.8*  HCT 34.1*  PLT 420*   No results for input(s): NA, K, CL, CO2, GLUCOSE, BUN, CREATININE, CALCIUM in the last 72 hours.  Intake/Output Summary (Last 24 hours) at 05/08/2019 0846 Last data filed at 05/07/2019 1752 Gross per 24 hour  Intake 480 ml  Output -  Net 480 ml     Physical Exam: Vital Signs Blood pressure 119/72, pulse 98, temperature 97.7 F (36.5 C), temperature source Oral, resp. rate 20, height 5\' 10"  (1.778 m), weight 105.5 kg, SpO2 100 %. Constitutional: No distress . Vital signs reviewed. HEENT: EOMI, oral membranes moist Neck: supple Cardiovascular: RRR without murmur. No JVD    Respiratory: CTA Bilaterally without wheezes or rales. Normal effort    GI: BS +, non-tender, non-distended  Musculoskeletal:  General: Edema: LE edema. Neurological: He is alertand oriented to person, place, and time. Nocranial nerve deficit. Patient is alert in no acute distress. Follows full commands.UE 4/5. LE 3/5 HF, KE and 4-/5 ADF/PF. No focal sensory changes. exam stable Skin: Skin iswarm. Back incision CDI without dressing, some scarring around area palpable Psychiatric:  pleasant     Assessment/Plan: 1. Functional deficits secondary to lumbar stenosis with radiculopathy which require 3+ hours per day of interdisciplinary therapy in a comprehensive inpatient rehab setting.  Physiatrist is providing close team supervision and 24 hour management of active medical problems listed below.  Physiatrist and rehab team continue to assess barriers to discharge/monitor patient progress toward functional and medical goals  Care Tool:  Bathing    Body parts bathed by patient: Right arm, Left arm, Chest, Abdomen, Face   Body parts bathed by helper: Buttocks Body parts n/a: Front perineal area, Right upper leg, Buttocks, Left upper leg, Right lower leg, Left lower leg(Did not attempt this session)   Bathing assist Assist Level: Supervision/Verbal cueing     Upper Body Dressing/Undressing Upper body dressing   What is the patient wearing?: Pull over shirt, Orthosis Orthosis activity level: Performed by helper  Upper body assist Assist Level: Supervision/Verbal cueing    Lower Body Dressing/Undressing Lower body dressing      What is the patient wearing?: Pants     Lower body assist Assist for lower body dressing: Contact Guard/Touching assist(Reacher)     Toileting Toileting    Toileting assist Assist for toileting: Minimal Assistance - Patient > 75% Assistive Device Comment: (Urinal)   Transfers Chair/bed transfer  Transfers assist  Chair/bed transfer activity did not occur: Safety/medical concerns  Chair/bed transfer assist level: Minimal Assistance - Patient > 75%     Locomotion Ambulation   Ambulation assist   Ambulation activity did not occur: Safety/medical concerns  Assist level: Minimal Assistance - Patient >  75% Assistive device: Walker-rolling Max distance: 90'   Walk 10 feet activity   Assist  Walk 10 feet activity did not occur: Safety/medical concerns  Assist level: Minimal Assistance - Patient > 75% Assistive  device: Walker-rolling   Walk 50 feet activity   Assist Walk 50 feet with 2 turns activity did not occur: Safety/medical concerns  Assist level: Minimal Assistance - Patient > 75% Assistive device: Walker-rolling    Walk 150 feet activity   Assist Walk 150 feet activity did not occur: Safety/medical concerns         Walk 10 feet on uneven surface  activity   Assist Walk 10 feet on uneven surfaces activity did not occur: Safety/medical concerns         Wheelchair     Assist Will patient use wheelchair at discharge?: Yes Type of Wheelchair: Manual Wheelchair activity did not occur: Safety/medical concerns         Wheelchair 50 feet with 2 turns activity    Assist    Wheelchair 50 feet with 2 turns activity did not occur: Safety/medical concerns       Wheelchair 150 feet activity     Assist Wheelchair 150 feet activity did not occur: Safety/medical concerns         Medical Problem List and Plan: 1.Decreased functional mobilitysecondary to lumbar stenosis, spondylolisthesis with radiculopathy. Status post L2-3, 3-4 and 4-5 lumbar interbody fusion with pedicle screw fixation 04/27/2019. Lumbar corset when out of bed --Continue CIR therapies including PT, OT   -team conf today 2. Antithrombotics: -pt with  DVT's of right mid femoral vein, right popliteal, right tib-peronal  -xarelto 15mg  bid to 20mg  daily, NS aware -antiplatelet therapy: N/A 3. Pain Management:Neurontin 300 mg 3 times daily,oxycodoneand Robaxin as needed  4. Mood:Provide emotional support -antipsychotic agents: N/A 5. Neuropsych: This patientiscapable of making decisions on hisown behalf. 6. Skin/Wound Care:Routine skin checks  -back wound cdi 7. Fluids/Electrolytes/Nutrition:encourage PO     8.Hypertension. Norvasc 10 mg daily, lisinopril 20 mg daily. Monitor with increased mobility  -controlled 6/15 9. COPD/asthma.  Continue nebulizers as directed 10.Primary immunodeficiency. Patient receivec HYQVIA50 g every 28 days with next scheduled dose was 05/04/2019. wife administered without any problems 11. GERD. Protonix 12. Hyperlipidemia. Pravachol  13. Constipation. Moving bowels  -senokot-s bid 14. Leukocytosis:  Down to 10.3 6/15  -had productive cough over weekend, cxr unremarkable  -ua neg, ucx pending, afebrile  -suspect d/t DVT's as well  -lungs clear, dc steroids  -continue levaquin for 7 days  LOS: 7 days A FACE TO FACE EVALUATION WAS PERFORMED  Ranelle OysterZachary T Treyvin Glidden 05/08/2019, 8:46 AM

## 2019-05-08 NOTE — Progress Notes (Signed)
Physical Therapy Session Note  Patient Details  Name: Patrick Waters MRN: [id number] Date of Birth: May 23, 1951  Today's Date: 05/08/2019 PT Individual Time: 0800-0900; 1430-1530 PT Individual Time Calculation (min): 60 min and 60 min  Short Term Goals: Week 1:  PT Short Term Goal 1 (Week 1): Pt will complete bed mobility with min assist. PT Short Term Goal 2 (Week 1): Pt will ambulate 50 ft with LRAD & min assist. PT Short Term Goal 3 (Week 1): Pt will negotiate 2 steps without rails with LRAD & mod assist to simulate home entrance. PT Short Term Goal 4 (Week 1): Pt will complete all transfers with min assist & LRAD. PT Short Term Goal 5 (Week 1): Pt will propel w/c 50 ft with supervision.  Skilled Therapeutic Interventions/Progress Updates:    Session 1: Pt received semi-reclined in bed, agreeable to PT session. Pt reports 7/10 pain in low back at rest as well as whole body soreness, RN able to provide pain medication at beginning of session. Semi-reclined to sitting EOB via logroll technique with CGA and use of bedrail. Pt is setup A to don lumbar corset while seated EOB. Stand pivot transfer bed to w/c with RW and min A. Pt is setup A to brush teeth while seated in w/c at sink. Manual w/c propulsion 2 x 150 ft with use of BUE and Supervision for global endurance training. Ambulation 3 x 90 ft with RW and CGA, decreased reliance on BUE on RW with gait this date. Standing alt L/R 4" and 6" step taps with RW and CGA for balance x 10 reps each. Pt left seated in w/c in room with needs in reach at end of session.  Session 2: Pt received semi-reclined in bed, agreeable to PT session. No complaints of pain. Bed mobility with CGA with HOB elevated and use of bedrail. Sit to stand with CGA to RW. Ambulation to bathroom with RW and CGA. Toilet transfer with CGA, assist for clothing management. Manual w/c propulsion x 150 ft with use of BUE and Supervision for global endurance training. Ascend/descend 4  stairs with 2 handrails and min A. Ascend/descend one 6" step forwards with one handrail and mod HHA, heavy reliance on UE forearm support. Ascend/descent one 4" step laterally with B HHA mod A. Ambulation x 125 ft with RW and close SBA to CGA. Nustep level 3, 2 x 5 min with use of B UE/LE for global endurance training. Sit to supine mod A for BLE management. Pt left semi-reclined in bed with needs in reach, bed alarm in place at end of session.   Therapy Documentation Precautions:  Precautions Precautions: Fall Required Braces or Orthoses: Spinal Brace Spinal Brace: Lumbar corset, Applied in sitting position Restrictions Weight Bearing Restrictions: No    Therapy/Group: Individual Therapy   Excell Seltzer, PT, DPT  05/08/2019, 12:12 PM

## 2019-05-08 NOTE — Progress Notes (Signed)
Occupational Therapy Session Note  Patient Details  Name: Patrick Waters MRN: [id number] Date of Birth: 1951-05-02  Today's Date: 05/08/2019 OT Individual Time: 1050-1200 OT Individual Time Calculation (min): 70 min    Short Term Goals: Week 1:  OT Short Term Goal 1 (Week 1): Pt will complete 2/3 toileting tasks with steadying assist OT Short Term Goal 2 (Week 1): Pt will don pants with steadying assist using AE PRN OT Short Term Goal 3 (Week 1): Pt will stand to complete 1 grooming task with steadying assist in order to increase functional standing endurance  Skilled Therapeutic Interventions/Progress Updates:    Pt seen for OT ADL bathing/dressing session. Pt sitting up in w/c upon arrival, complaints of fatigue from previous tx session and agreeing to bathing at shower level. He completed sit<>stands throughout session with RW and supervision. He ambulated within room with close supervision to gather clothing items from drawers with VCs for RW management in functional context.  He doffed clothing seated on toilet using reacher to assist. VCs for safety awareness including sitting to remove shirt vs. Standing. Pt demonstrates poor memory/ carry over throughout session of safety cues and was not able to recall shower transfer technique taught in previous sessions.  He completed shower stall transfer with CGA and VCs for technique of transfer. He bathed seated on shower chair with set-up and intermittent supervision using LH sponge and VCs for lateral leans to complete buttock hygiene.  He ambulated out of bathroom to w/c to dress. Completed LB dressing sit>stand with supervision. Set-up UB dressing and back brace.  Following seated rest break, he ambulated to sink and completed oral care in standing with supervision.  Pt returned to sitting in w/c at end of session, left seated with all needs in reach.  Pt provided therapist with pictures of home bathroom set-up, discussed DME with  recommendation for shower chair with arm rests vs. Built in support in shower. Pt voiced understanding and agreement with recommendation. Discussed d/c planning and planning for family education session prior to d/c home at supervision level.   Therapy Documentation Precautions:  Precautions Precautions: Fall Required Braces or Orthoses: Spinal Brace Spinal Brace: Lumbar corset, Applied in sitting position Restrictions Weight Bearing Restrictions: No   Therapy/Group: Individual Therapy  Keajah Killough L 05/08/2019, 7:04 AM

## 2019-05-08 NOTE — Progress Notes (Signed)
Patient not available at this time for his breathing treatments. RT will check back at a later time.

## 2019-05-09 ENCOUNTER — Inpatient Hospital Stay (HOSPITAL_COMMUNITY): Payer: Medicare Other | Admitting: Physical Therapy

## 2019-05-09 ENCOUNTER — Inpatient Hospital Stay (HOSPITAL_COMMUNITY): Payer: Medicare Other | Admitting: Occupational Therapy

## 2019-05-09 MED ORDER — OMALIZUMAB 150 MG/ML ~~LOC~~ SOSY
375.0000 mg | PREFILLED_SYRINGE | Freq: Once | SUBCUTANEOUS | Status: DC
  Filled 2019-05-09: qty 3

## 2019-05-09 NOTE — Progress Notes (Addendum)
Physical Therapy Session Note  Patient Details  Name: Patrick Waters MRN: [id number] Date of Birth: [birth year]  Today's Date: 05/09/2019 PT Individual Time: 1135-1230 PT Individual Time Calculation (min): 55 min   Short Term Goals: Week 1:  PT Short Term Goal 1 (Week 1): Pt will complete bed mobility with min assist. PT Short Term Goal 2 (Week 1): Pt will ambulate 50 ft with LRAD & min assist. PT Short Term Goal 3 (Week 1): Pt will negotiate 2 steps without rails with LRAD & mod assist to simulate home entrance. PT Short Term Goal 4 (Week 1): Pt will complete all transfers with min assist & LRAD. PT Short Term Goal 5 (Week 1): Pt will propel w/c 50 ft with supervision.  Skilled Therapeutic Interventions/Progress Updates:  Pt received in w/c & agreeable to tx. Pt reports 4/10 incision pain but states he's premedicated. Pt propels w/c w/c room>gym with BUE & supervision for cardiopulmonary endurance training. Pt transfers sit<>stand with supervision and ambulates 40 ft + 90 ft with RW & supervision. Pt tightened brace with cuing to do so. While standing with RW for BUE support pt performed the following BLE strengthening exercises with instructional cuing for technique: marches, heel raises, mini squats. Pt with more impaired coordination LLE compared to RLE. Pt performed standing toe taps to colored targets on 3" step with BUE then 1 UE support and min assist for standing balance with task focusing on BLE coordination & strengthening & overall balance. Pt propels w/c back to room & performs BLE hip adduction pillow squeezes exercises for strengthening. Pt performs hand hygiene from w/c level with assistance to obtain paper towels. Pt left in w/c with all needs in reach.  Therapy Documentation Precautions:  Precautions Precautions: Fall Required Braces or Orthoses: Spinal Brace Spinal Brace: Lumbar corset, Applied in sitting position Restrictions Weight Bearing Restrictions:  No    Therapy/Group: Individual Therapy  Waunita Schooner 05/09/2019, 12:46 PM

## 2019-05-09 NOTE — Discharge Summary (Signed)
Physician Discharge Summary  Patient ID: Patrick Waters MRN: [id number] DOB/AGE: September 21, 1951 68 y.o.  Admit date: 05/01/2019 Discharge date: 05/11/2019  Discharge Diagnoses:  Active Problems:   Lumbar radiculopathy Lumbar stenosis with radiculopathy Right mid femoral vein, right popliteal and right tibioperoneal DVT Pain management Hypertension COPD with asthma Primary immunodeficiency GERD Hyperlipidemia  Discharged Condition: Stable  Significant Diagnostic Studies: Dg Chest 2 View  Result Date: 05/06/2019 CLINICAL DATA:  Greenish sputum.  Coughing. EXAM: CHEST - 2 VIEW COMPARISON:  None. FINDINGS: The heart size and mediastinal contours are within normal limits. Both lungs are clear. The visualized skeletal structures are unremarkable. IMPRESSION: No active cardiopulmonary disease. Electronically Signed   By: Gerome Sam III M.D   On: 05/06/2019 08:49   Dg Lumbar Spine 2-3 Views  Result Date: 04/27/2019 CLINICAL DATA:  L2-L5 TLIF EXAM: DG C-ARM 61-120 MIN; LUMBAR SPINE - 2-3 VIEW COMPARISON:  01/17/2019 Correlation: MRI lumbar spine 12/20/2018 FLUOROSCOPY TIME:  1 minutes 34 seconds Images submitted: 4 FINDINGS: Five lumbar vertebra by prior radiographs, current exam labeled accordingly. BILATERAL pedicle screws placed at L2, L3, L4 and L5. Intervening disc prostheses at the L2-L3, L3-L4 and L4-L5 disc spaces. Bones appear demineralized. No obvious fracture or bone destruction. IMPRESSION: Intraoperative images during L2-L5 fusion. Electronically Signed   By: Ulyses Southward M.D.   On: 04/27/2019 16:36   Dg C-arm 1-60 Min  Result Date: 04/27/2019 CLINICAL DATA:  L2-L5 TLIF EXAM: DG C-ARM 61-120 MIN; LUMBAR SPINE - 2-3 VIEW COMPARISON:  01/17/2019 Correlation: MRI lumbar spine 12/20/2018 FLUOROSCOPY TIME:  1 minutes 34 seconds Images submitted: 4 FINDINGS: Five lumbar vertebra by prior radiographs, current exam labeled accordingly. BILATERAL pedicle screws placed at L2, L3, L4 and L5.  Intervening disc prostheses at the L2-L3, L3-L4 and L4-L5 disc spaces. Bones appear demineralized. No obvious fracture or bone destruction. IMPRESSION: Intraoperative images during L2-L5 fusion. Electronically Signed   By: Ulyses Southward M.D.   On: 04/27/2019 16:36   Vas [place] Lower Extremity Venous (dvt)  Result Date: 05/03/2019  Lower Venous Study Indications: Swelling.  Risk Factors: Surgery: recent back surgery. Comparison Study: No prior study. Performing Technologist: Gertie Fey MHA, RDMS, RVT, RDCS  Examination Guidelines: A complete evaluation includes B-mode imaging, spectral Doppler, color Doppler, and power Doppler as needed of all accessible portions of each vessel. Bilateral testing is considered an integral part of a complete examination. Limited examinations for reoccurring indications may be performed as noted.  +----------------+---------------+---------+-----------+----------+------------+ RIGHT           CompressibilityPhasicitySpontaneityPropertiesSummary      +----------------+---------------+---------+-----------+----------+------------+ CFV             Full           No       Yes                  pulsatile                                                                 flow         +----------------+---------------+---------+-----------+----------+------------+ SFJ             Full                                                      +----------------+---------------+---------+-----------+----------+------------+  FV Prox         Full                                                      +----------------+---------------+---------+-----------+----------+------------+ FV Mid          Partial                 Yes                  Acute        +----------------+---------------+---------+-----------+----------+------------+ FV Distal       Full                                                       +----------------+---------------+---------+-----------+----------+------------+ PFV             Full                                                      +----------------+---------------+---------+-----------+----------+------------+ POP             None           No       Yes                  Acute.                                                                    Pulsatile                                                                 flow         +----------------+---------------+---------+-----------+----------+------------+ PTV             Full                    Yes                               +----------------+---------------+---------+-----------+----------+------------+ PERO            Full                    Yes                               +----------------+---------------+---------+-----------+----------+------------+ Tibioperoneal   None           No       Yes  Acute.       trunk                                                        Pulsatile                                                                 flow         +----------------+---------------+---------+-----------+----------+------------+   +---------+---------------+---------+-----------+----------+------------------+ LEFT     CompressibilityPhasicitySpontaneityPropertiesSummary            +---------+---------------+---------+-----------+----------+------------------+ CFV      Full           No       Yes                  Slightly pulsatile +---------+---------------+---------+-----------+----------+------------------+ SFJ      Full                                                            +---------+---------------+---------+-----------+----------+------------------+ FV Prox  Full                                                            +---------+---------------+---------+-----------+----------+------------------+ FV Mid    Full                                                            +---------+---------------+---------+-----------+----------+------------------+ FV DistalFull                                                            +---------+---------------+---------+-----------+----------+------------------+ PFV      Full                                                            +---------+---------------+---------+-----------+----------+------------------+ POP      Full           No       Yes                  Slightly pulsatile +---------+---------------+---------+-----------+----------+------------------+ PTV      Full                                                            +---------+---------------+---------+-----------+----------+------------------+  PERO     Full                                                            +---------+---------------+---------+-----------+----------+------------------+     Summary: Right: Findings consistent with acute deep vein thrombosis involving the right mid femoral vein, right popliteal vein, and right tibioperoneal trunk. No cystic structure found in the popliteal fossa. Left: There is no evidence of deep vein thrombosis in the lower extremity. No cystic structure found in the popliteal fossa.  *See table(s) above for measurements and observations. Electronically signed by Lemar LivingsBrandon Cain MD on 05/03/2019 at 5:34:17 PM.    Final     Labs:  Basic Metabolic Panel: No results for input(s): NA, K, CL, CO2, GLUCOSE, BUN, CREATININE, CALCIUM, MG, PHOS in the last 168 hours.  CBC: Recent Labs  Lab 05/04/19 0728 05/07/19 0746  WBC 15.5* 10.3  HGB 13.0 11.8*  HCT 38.0* 34.1*  MCV 89.2 88.3  PLT 238 420*    CBG: No results for input(s): GLUCAP in the last 168 hours.  Family history.  Father with COPD Brother with diabetes.  Denies any colon cancer  Brief HPI: Patrick Waters is a 68 year old right-handed male with history of primary  immunodeficiency who receives Hyqvia 50 g every 28 days, COPD, prior lumbar and cervical back surgeries.  Per chart review patient lives with spouse.  He is retired independent and still drives.  One level home with one-step to entry.  Presented 04/27/2019 with persistent low back pain radiating to the lower extremities.  X-rays and imaging revealed lumbar foraminal stenosis spondylolisthesis with herniated disc scoliosis with lumbar radiculopathy L2-3, 3-4 and 4-5.  Underwent lumbar 2-33-4 and 4-5 lumbar interbody fusion with redo decompression pedicle screw fixation 04/27/2019 per Dr. Venetia MaxonStern.  Hospital course pain management.  Lumbar corset when out of bed.  Bouts of constipation with laxative assistance provided.  Therapy evaluations completed and patient was admitted for a comprehensive rehab program     Hospital Course: Frutoso SchatzRonnie D Gendreau was admitted to rehab 05/01/2019 for inpatient therapies to consist of PT, ST and OT at least three hours five days a week. Past admission physiatrist, therapy team and rehab RN have worked together to provide customized collaborative inpatient rehab.  Pertaining to patient lumbar radiculopathy with spondylolisthesis.  Underwent lumbar 2-3, 3-4 and 4-5 interbody fusion 04/27/2019.  Back incision healing nicely lumbar corset when out of bed he would follow-up neurosurgery.  Vascular studies completed 05/03/2019 showing right mid femoral vein, right popliteal, right tibial peritoneal DVT.  Neurosurgery was consulted in regards to anticoagulation and Xarelto was initiated.  No bleeding episodes noted.  Pain management use of Neurontin 3 times daily as well as oxycodone and Robaxin as needed.  Blood pressures controlled with Norvasc and lisinopril no orthostasis.  He would follow-up with his primary MD.  COPD with asthma nebulizers as directed oxygen saturations greater than 90% on room air.  Patient with history of primary immunodeficiency he did receive HYQVIA 50 g every 28 days which was  scheduled 05/04/2019 and administered with the assistance of his wife who had been through the necessary training.  He would follow-up outpatient.  He continued on Pravachol for hyperlipidemia.  Mild elevation in white blood cell count 15,500 with urinalysis completed and urine culture  greater than 100,000 Enterobacter Serratia and currently maintained on Levaquin.  Patient remained afebrile voiding without difficulty.   Physical exam blood pressure 134/82 pulse 73 temperature 97.5 respirations 18 oxygen saturations 96% room air Constitutional.  Oriented x3 well-developed HEENT Head.  Normocephalic and atraumatic Eyes.  Pupils round and reactive to light without discharge no nystagmus Neck was supple nontender no JVD without thyromegaly Respiratory effort normal no respiratory distress he has no wheezes GI.  Soft nontender no rebound positive bowel sounds Musculoskeletal mild lower extremity edema with palpable pedal pulses Neurological.  Alert and oriented no cranial nerve deficits alert no acute distress follows full commands upper extremities 4 out of 5 lower extremities 3 out of 5 hip flexors, knee extension and 4- out of 5 ankle dorsi plantarflexion no focal sensory changes Skin.  Back incision clean and dry  Rehab course: During patient's stay in rehab weekly team conferences were held to monitor patient's progress, set goals and discuss barriers to discharge. At admission, patient required moderate assist ambulate 4 feet rolling walker, moderate assist sit to supine, moderate assist side-lying to sitting.  Minimal assist upper body bathing max assist lower body bathing minimal assist upper body dressing max assist lower body dressing  He  has had improvement in activity tolerance, balance, postural control as well as ability to compensate for deficits. He has had improvement in functional use RUE/LUE  and RLE/LLE as well as improvement in awareness.  Working with energy conservation  techniques.  Sit to stand with contact-guard assist with rolling walker ambulates to the bathroom with rolling walker and contact-guard assist.  Toilet transfers with contact-guard assist, minimal assist for clothing management.  Ambulates 100 feet rolling walker supervision.  He can gather his belongings for activities the living and homemaking.  He was advised no driving.  Family teaching completed and plan discharged to home       Disposition: Discharge disposition: 01-Home or Self Care     Discharged to home   Diet: Regular  Special Instructions: No smoking driving or alcohol  Back brace when out of bed  Medications on discharge. 1.  Tylenol as needed 2.Risaquad 1 capsule daily 3.  Norvasc 10 mg every evening 4.  Nasal spray 2 sprays each nostril twice daily 5.  Vitamin D 5000 units every evening 6.  Flonase 1 spray each nostril twice daily 7.  Neurontin 300 mg 3 times daily 8.  Mucinex 600 mg twice daily 9.  Levaquin 500 mg daily until 05/12/2019 and stop 10.  Lisinopril 20 mg p.o. daily 11.  Claritin 10 mg daily 12.  Robaxin 500 mg every 6 hours as needed muscle spasms 13.  Dulera 2 puffs twice daily 14.  Oxycodone 1 tablet every 4 hours as needed pain 15.  Protonix 40 mg p.o. daily 16.  Pravachol 40 mg daily 17.  Xarelto 15 mg twice daily until 05/25/2019 then begin 20 mg daily 18.  Vitamin B12 5000 mcg every evening    Follow-up Information    Ranelle OysterSwartz, Zachary T, MD Follow up.   Specialty: Physical Medicine and Rehabilitation Why: only as needed Contact information: [address] [place] [address] [phone]        Maeola HarmanStern, Joseph, MD Follow up.   Specialty: Neurosurgery Why: Call for appointment Contact information: [address] [place] [address] [phone]           Signed: Mcarthur RossettiDaniel J Mithran Strike 05/11/2019, 6:06 AM

## 2019-05-09 NOTE — Progress Notes (Signed)
[place] PHYSICAL MEDICINE & REHABILITATION PROGRESS NOTE   Subjective/Complaints: Feeling well, sore from therapy but pleased with progress  ROS: Patient denies fever, rash, sore throat, blurred vision, nausea, vomiting, diarrhea, cough, shortness of breath or chest pain,   headache, or mood change.     .    Objective:   No results found. Recent Labs    05/07/19 0746  WBC 10.3  HGB 11.8*  HCT 34.1*  PLT 420*   No results for input(s): NA, K, CL, CO2, GLUCOSE, BUN, CREATININE, CALCIUM in the last 72 hours.  Intake/Output Summary (Last 24 hours) at 05/09/2019 0910 Last data filed at 05/09/2019 0820 Gross per 24 hour  Intake 480 ml  Output -  Net 480 ml     Physical Exam: Vital Signs Blood pressure 135/79, pulse 93, temperature 97.6 F (36.4 C), temperature source Oral, resp. rate 16, height 5\' 10"  (1.778 m), weight 106 kg, SpO2 98 %. Constitutional: No distress . Vital signs reviewed. HEENT: EOMI, oral membranes moist Neck: supple Cardiovascular: RRR without murmur. No JVD    Respiratory: CTA Bilaterally without wheezes or rales. Normal effort    GI: BS +, non-tender, non-distended  Musculoskeletal:   General: Edema: LE edema. Neurological: He is alertand oriented to person, place, and time. Nocranial nerve deficit. Patient is alert in no acute distress. Follows full commands.UE 4/5. LE 3/5 HF, KE and 4-/5 ADF/PF. No sensory def Skin: Skin iswarm. Back incision CDI with honeycomb dressing Psychiatric: pleasant     Assessment/Plan: 1. Functional deficits secondary to lumbar stenosis with radiculopathy which require 3+ hours per day of interdisciplinary therapy in a comprehensive inpatient rehab setting.  Physiatrist is providing close team supervision and 24 hour management of active medical problems listed below.  Physiatrist and rehab team continue to assess barriers to discharge/monitor patient progress toward functional and medical  goals  Care Tool:  Bathing    Body parts bathed by patient: Right arm, Right lower leg, Left arm, Left lower leg, Chest, Face, Abdomen, Front perineal area, Buttocks, Right upper leg, Left upper leg   Body parts bathed by helper: Buttocks Body parts n/a: Front perineal area, Right upper leg, Buttocks, Left upper leg, Right lower leg, Left lower leg(Did not attempt this session)   Bathing assist Assist Level: Supervision/Verbal cueing     Upper Body Dressing/Undressing Upper body dressing   What is the patient wearing?: Pull over shirt, Orthosis Orthosis activity level: Performed by patient  Upper body assist Assist Level: Set up assist    Lower Body Dressing/Undressing Lower body dressing      What is the patient wearing?: Pants, Underwear/pull up     Lower body assist Assist for lower body dressing: Supervision/Verbal cueing     Toileting Toileting    Toileting assist Assist for toileting: Minimal Assistance - Patient > 75% Assistive Device Comment: (Urinal)   Transfers Chair/bed transfer  Transfers assist  Chair/bed transfer activity did not occur: Safety/medical concerns  Chair/bed transfer assist level: Contact Guard/Touching assist     Locomotion Ambulation   Ambulation assist   Ambulation activity did not occur: Safety/medical concerns  Assist level: Contact Guard/Touching assist Assistive device: Walker-rolling Max distance: 125'   Walk 10 feet activity   Assist  Walk 10 feet activity did not occur: Safety/medical concerns  Assist level: Contact Guard/Touching assist Assistive device: Walker-rolling   Walk 50 feet activity   Assist Walk 50 feet with 2 turns activity did not occur: Safety/medical concerns  Assist level:  Contact Guard/Touching assist Assistive device: Walker-rolling    Walk 150 feet activity   Assist Walk 150 feet activity did not occur: Safety/medical concerns         Walk 10 feet on uneven surface   activity   Assist Walk 10 feet on uneven surfaces activity did not occur: Safety/medical concerns         Wheelchair     Assist Will patient use wheelchair at discharge?: No Type of Wheelchair: Manual Wheelchair activity did not occur: Safety/medical concerns  Wheelchair assist level: Supervision/Verbal cueing Max wheelchair distance: 150'    Wheelchair 50 feet with 2 turns activity    Assist    Wheelchair 50 feet with 2 turns activity did not occur: Safety/medical concerns   Assist Level: Supervision/Verbal cueing   Wheelchair 150 feet activity     Assist Wheelchair 150 feet activity did not occur: Safety/medical concerns   Assist Level: Supervision/Verbal cueing     Medical Problem List and Plan: 1.Decreased functional mobilitysecondary to lumbar stenosis, spondylolisthesis with radiculopathy. Status post L2-3, 3-4 and 4-5 lumbar interbody fusion with pedicle screw fixation 04/27/2019. Lumbar corset when out of bed --Continue CIR therapies including PT, OT   -ELOS 6/19 2. Antithrombotics: -pt with  DVT's of right mid femoral vein, right popliteal, right tib-peronal  -xarelto 15mg  bid to 20mg  daily, NS aware -antiplatelet therapy: N/A 3. Pain Management:Neurontin 300 mg 3 times daily,oxycodoneand Robaxin as needed  4. Mood:Provide emotional support -antipsychotic agents: N/A 5. Neuropsych: This patientiscapable of making decisions on hisown behalf. 6. Skin/Wound Care:Routine skin checks  -back wound cdi, will remove honeycomb tomorrow 7. Fluids/Electrolytes/Nutrition:encourage PO     8.Hypertension. Norvasc 10 mg daily, lisinopril 20 mg daily. Monitor with increased mobility  -controlled 6/17 9. COPD/asthma. Continue nebulizers as directed 10.Primary immunodeficiency. Patient receivec HYQVIA50 g every 28 days with next scheduled dose was 05/04/2019. wife administered without any problems 11.  GERD. Protonix 12. Hyperlipidemia. Pravachol  13. Constipation. Moving bowels  -senokot-s bid 14. Leukocytosis:  Down to 10.3 6/15  -had productive cough over weekend, cxr unremarkable  -ua neg, ucx pending, afebrile  -suspect d/t DVT's as well  -lungs clear  -steroids dc'ed  -continue levaquin for 7 days  LOS: 8 days A FACE TO FACE EVALUATION WAS PERFORMED  Meredith Staggers 05/09/2019, 9:10 AM

## 2019-05-09 NOTE — Progress Notes (Signed)
Physical Therapy Session Note  Patient Details  Name: Patrick Waters MRN: [id number] Date of Birth: [birth year]  Today's Date: 05/09/2019 PT Individual Time: 1430-1530 PT Individual Time Calculation (min): 60 min   Short Term Goals: Week 1:  PT Short Term Goal 1 (Week 1): Pt will complete bed mobility with min assist. PT Short Term Goal 2 (Week 1): Pt will ambulate 50 ft with LRAD & min assist. PT Short Term Goal 3 (Week 1): Pt will negotiate 2 steps without rails with LRAD & mod assist to simulate home entrance. PT Short Term Goal 4 (Week 1): Pt will complete all transfers with min assist & LRAD. PT Short Term Goal 5 (Week 1): Pt will propel w/c 50 ft with supervision.  Skilled Therapeutic Interventions/Progress Updates:    Pt received seated EOB, agreeable to PT session. No complaints of pain at rest. Sit to stand with CGA to RW. Ambulation to bathroom with RW and CGA. Toilet transfer with CGA, min A for clothing management and max A for pericare following BM. Manual w/c propulsion x 150 ft with use of BUE and Supervision for global endurance training. Ascend/descend one 4" step with BUE min A HHA laterally to simulate home environment x 3 reps. Ambulation x 100 ft with RW and Supervision before onset of fatigue. Sit to/from supine from [facility] elevated bed without use of bedrails with CGA to simulate home environment. Pt left semi-reclined in bed with needs in reach at end of session.  Therapy Documentation Precautions:  Precautions Precautions: Fall Required Braces or Orthoses: Spinal Brace Spinal Brace: Lumbar corset, Applied in sitting position Restrictions Weight Bearing Restrictions: No    Therapy/Group: Individual Therapy   Excell Seltzer, PT, DPT  05/09/2019, 3:48 PM

## 2019-05-09 NOTE — Progress Notes (Signed)
Occupational Therapy Session Note  Patient Details  Name: Patrick Waters MRN: [id number] Date of Birth: October 10, 1951  Today's Date: 05/09/2019 OT Individual Time: 1308-6578 OT Individual Time Calculation (min): 68 min    Short Term Goals: Week 1:  OT Short Term Goal 1 (Week 1): Pt will complete 2/3 toileting tasks with steadying assist OT Short Term Goal 2 (Week 1): Pt will don pants with steadying assist using AE PRN OT Short Term Goal 3 (Week 1): Pt will stand to complete 1 grooming task with steadying assist in order to increase functional standing endurance  Skilled Therapeutic Interventions/Progress Updates:    Pt seen for OT session focusing on functional transfers and LE strengthening in prep for functional transfers. Pt in supine upon arrival, denying pain and agreeable to tx session.  He transferred to sitting EOB with supervision with HOB elevated without bed rails.  He ambulated throughout session with close supervision using RW. He completed oral care standing at sink with supervision and UE support.  He self propelled w/c throughout unit with supervision for UE strengthening and endurance.  In ADL apartment, completed simulated shower stall transfer. Pt requiring re-education/demonstration regarding technique for transfer. He return demonstrated with close supervision.  Following seated rest break, he ambulated to recliner and completed sit>stand from 17" soft recliner with increased effort and supervision.  Pt transported to therapy gym and transitioned to EOM. Completed x3 sets of 5 sit>stand from slightly elevated EOM without UE support. Competed with CGA and increased time with rest breaks provided btwn sets. Completed x1 set of 3 with mat lowered and min A.  Following extended seated rest break pt ambulated back to room with close supervision and VCs to lessen UE reliance on RW. Pt left seated in w/c at end of session, all needs in reach. Education provided throughout session  regarding DME, continuum of care, energy conservation, decreasing fall risk and home modifications and d/c planning.    Therapy Documentation Precautions:  Precautions Precautions: Fall Required Braces or Orthoses: Spinal Brace Spinal Brace: Lumbar corset, Applied in sitting position Restrictions Weight Bearing Restrictions: No   Therapy/Group: Individual Therapy  Mckennon Zwart L 05/09/2019, 6:56 AM

## 2019-05-10 ENCOUNTER — Inpatient Hospital Stay (HOSPITAL_COMMUNITY): Payer: Medicare Other | Admitting: Occupational Therapy

## 2019-05-10 ENCOUNTER — Inpatient Hospital Stay (HOSPITAL_COMMUNITY): Payer: Medicare Other | Admitting: Physical Therapy

## 2019-05-10 MED ORDER — LISINOPRIL 20 MG PO TABS: 20.0000 mg | ORAL_TABLET | Freq: Every day | ORAL | 0 refills | Status: AC

## 2019-05-10 MED ORDER — GABAPENTIN 300 MG PO CAPS: 300.0000 mg | ORAL_CAPSULE | Freq: Three times a day (TID) | ORAL | 1 refills | Status: AC

## 2019-05-10 MED ORDER — RIVAROXABAN 20 MG PO TABS: 20.0000 mg | ORAL_TABLET | Freq: Every day | ORAL | 0 refills | Status: DC

## 2019-05-10 MED ORDER — METHOCARBAMOL 500 MG PO TABS: 500.0000 mg | ORAL_TABLET | Freq: Four times a day (QID) | ORAL | 0 refills | Status: AC | PRN

## 2019-05-10 MED ORDER — RIVAROXABAN 20 MG PO TABS: 20.0000 mg | ORAL_TABLET | Freq: Every day | ORAL | 0 refills | Status: AC

## 2019-05-10 MED ORDER — POLYETHYLENE GLYCOL 3350 17 G PO PACK: 17.0000 g | PACK | Freq: Every day | ORAL | 0 refills | Status: AC | PRN

## 2019-05-10 MED ORDER — OXYCODONE HCL 5 MG PO TABS: 5.0000 mg | ORAL_TABLET | ORAL | 0 refills | Status: DC | PRN

## 2019-05-10 MED ORDER — ACETAMINOPHEN 325 MG PO TABS: 650.0000 mg | ORAL_TABLET | ORAL | Status: AC | PRN

## 2019-05-10 MED ORDER — AMLODIPINE BESYLATE 10 MG PO TABS: 10.0000 mg | ORAL_TABLET | Freq: Every day | ORAL | 0 refills | Status: AC

## 2019-05-10 MED ORDER — LEVOFLOXACIN 500 MG PO TABS: 500.0000 mg | ORAL_TABLET | Freq: Every day | ORAL | 0 refills | Status: AC

## 2019-05-10 MED ORDER — RIVAROXABAN 15 MG PO TABS: 15.0000 mg | ORAL_TABLET | Freq: Two times a day (BID) | ORAL | 0 refills | Status: AC

## 2019-05-10 MED ORDER — OXYCODONE-ACETAMINOPHEN 5-325 MG PO TABS: 1.0000 | ORAL_TABLET | ORAL | 0 refills | Status: DC | PRN

## 2019-05-10 NOTE — Progress Notes (Signed)
Physical Therapy Discharge Summary  Patient Details  Name: Patrick Waters MRN: [id number] Date of Birth: [birth year]  Today's Date: 05/10/2019  Time: 1030-1053 23 minutes  Pt with no c/o pain. Session focused on family ed with pt/wife. Pt/wife educated on and demonstrated RW backwards up 2 stairs to simulate home entry.  Wife able to provide min A for multiple attempts. Pt given walker balls to improve RW efficiency. Pt able to state 3/3 back precautions without cuing. Pt/wife state they feel ready for d/c.  Patient has met 11 of 11 long term goals due to improved activity tolerance, improved balance, improved postural control, increased strength and ability to compensate for deficits.  Patient to discharge at an ambulatory level Supervision.   Patient's care partner is independent to provide the necessary physical assistance at discharge.  Reasons goals not met: n/a  Recommendation:  Patient will benefit from ongoing skilled PT services in home health setting to continue to advance safe functional mobility, address ongoing impairments in endurance, LE strength, balance, safety, pain management, independence with functional mobility, and minimize fall risk.  Equipment: wide RW  Reasons for discharge: treatment goals met and discharge from hospital  Patient/family agrees with progress made and goals achieved: Yes  PT Discharge Precautions/Restrictions Precautions Precautions: Fall;Back Required Braces or Orthoses: Spinal Brace Spinal Brace: Lumbar corset;Applied in sitting position Restrictions Weight Bearing Restrictions: No Vision/Perception  Perception Perception: Within Functional Limits Praxis Praxis: Intact  Cognition Overall Cognitive Status: Within Functional Limits for tasks assessed Arousal/Alertness: Awake/alert Orientation Level: Oriented X4 Attention: Sustained Sustained Attention: Appears intact Memory: Appears intact Awareness: Appears intact Problem Solving:  Appears intact Safety/Judgment: Appears intact Sensation Sensation Light Touch: Impaired Detail Peripheral sensation comments: N/T in BLE, L>R Light Touch Impaired Details: Impaired RLE;Impaired LLE Coordination Gross Motor Movements are Fluid and Coordinated: No Fine Motor Movements are Fluid and Coordinated: Yes Coordination and Movement Description: impaired 2/2 ongoing generalized weakness, improved since eval Motor  Motor Motor: Within Functional Limits Motor - Discharge Observations: ongoing generalized weakness, improved since eval  Mobility Bed Mobility Bed Mobility: Rolling Right;Rolling Left;Supine to Sit;Sit to Supine Rolling Right: Independent with assistive device Rolling Left: Independent with assistive device Supine to Sit: Independent with assistive device Sit to Supine: Independent with assistive device Transfers Transfers: Sit to Stand;Stand Pivot Transfers Sit to Stand: Independent with assistive device Stand to Sit: Independent with assistive device Stand Pivot Transfers: Independent with assistive device Transfer (Assistive device): Rolling walker Locomotion  Gait Ambulation: Yes Gait Assistance: Supervision/Verbal cueing Gait Distance (Feet): 150 Feet Assistive device: Rolling walker Gait Assistance Details: Verbal cues for precautions/safety Gait Gait: Yes Gait Pattern: Impaired Gait Pattern: Decreased step length - right;Decreased step length - left;Wide base of support(trunk flexed) Gait velocity: decreased Stairs / Additional Locomotion Stairs: Yes Stairs Assistance: Moderate Assistance - Patient 50 - 74% Stair Management Technique: No rails;Other (comment)(B HHA) Number of Stairs: 2 Height of Stairs: 6 Wheelchair Mobility Wheelchair Mobility: Yes Wheelchair Assistance: Independent with Camera operator: Both upper extremities Wheelchair Parts Management: Needs assistance Distance: 150  Trunk/Postural Assessment   Cervical Assessment Cervical Assessment: Within Functional Limits Thoracic Assessment Thoracic Assessment: Exceptions to WFL(kyphotic; rounded shoulders) Lumbar Assessment Lumbar Assessment: Exceptions to WFL(lumbar corset; posterior pelvic tilt) Postural Control Postural Control: Deficits on evaluation Righting Reactions: delayed Protective Responses: delayed  Balance Balance Balance Assessed: Yes Static Sitting Balance Static Sitting - Balance Support: No upper extremity supported;Feet supported Static Sitting - Level of Assistance: 6: Modified independent (Device/Increase time) Dynamic  Sitting Balance Dynamic Sitting - Balance Support: No upper extremity supported;Feet supported;During functional activity Dynamic Sitting - Level of Assistance: 6: Modified independent (Device/Increase time);5: Stand by assistance Static Standing Balance Static Standing - Balance Support: Bilateral upper extremity supported;During functional activity Static Standing - Level of Assistance: 6: Modified independent (Device/Increase time);5: Stand by assistance Dynamic Standing Balance Dynamic Standing - Balance Support: Bilateral upper extremity supported;During functional activity Dynamic Standing - Level of Assistance: 5: Stand by assistance Extremity Assessment   RLE Assessment RLE Assessment: Within Functional Limits General Strength Comments: see below RLE Strength Right Hip Flexion: 4/5 Right Knee Flexion: 4/5 Right Knee Extension: 4+/5 Right Ankle Dorsiflexion: 4/5 LLE Assessment LLE Assessment: Within Functional Limits General Strength Comments: see below LLE Strength Left Hip Flexion: 4/5 Left Knee Flexion: 4/5 Left Knee Extension: 4/5 Left Ankle Dorsiflexion: 4/5     Excell Seltzer, PT, DPT 05/10/2019, 9:45 AM

## 2019-05-10 NOTE — Patient Care Conference (Signed)
Inpatient RehabilitationTeam Conference and Plan of Care Update Date: 05/08/2019   Time: 2:15 PM    Patient Name: Patrick Waters      Medical Record Number: [id number]  Date of Birth: Sep 27, 1951 Sex: Male         Room/Bed: 4W05C/4W05C-01 Payor Info: Payor: MEDICARE / Plan: MEDICARE PART A AND B / Product Type: *No Product type* /    Admitting Diagnosis: 1. SCI Team  Other Neuro, lumbar radiculopathy; 13-14days  Admit Date/Time:  05/01/2019  5:44 PM Admission Comments: No comment available   Primary Diagnosis:  <principal problem not specified> Principal Problem: <principal problem not specified>  Patient Active Problem List   Diagnosis Date Noted  . Lumbar radiculopathy 05/01/2019  . Idiopathic scoliosis of thoracolumbar region 04/27/2019  . Spondylolisthesis of cervical region 02/14/2018    Expected Discharge Date: Expected Discharge Date: 05/11/19(after education with wife)  Team Members Present: Physician leading conference: Dr. Alger Simons Social Worker Present: Alfonse Alpers, LCSW Nurse Present: Mohammed Kindle, LPN PT Present: Excell Seltzer, PT OT Present: Amy Rounds, OT SLP Present: Charolett Bumpers, SLP PPS Coordinator present : Gunnar Fusi, SLP     Current Status/Progress Goal Weekly Team Focus  Medical   lumbar radiculopathy, pain improving. moving bowels. bronchitis responded to abx/steroids, wounds healing  improve activity tolerance  bronchitis rx, wound care, pain control   Bowel/Bladder   Pt is continent of B/B, LBM 05/08/19  Remain continent of B/B, with normal bowel funciton.  Toilet pt PRN, medicate as needed.   Swallow/Nutrition/ Hydration             ADL's   Steadying assist overall using RW and AE for ADLs. Increased time and rest breaks required for all tasks  Set-up- supervision overall; min A shower stall transfer  ADL re-training, functional activity tolerance, functional mobility and transfers, d/c planning   Mobility   mod A bed mobility, min A  transfers and gait up to 100' with RW, mod A 4 stairs with 2 handrails  Supervision gait, min A stairs, mod I w/c  endurance, gait training, stairs, bed mobility   Communication             Safety/Cognition/ Behavioral Observations            Pain   Pt c/o of 6/10 pain, increased pain with movement.  Pain level <2  Assess pain Q2H/PRN, medicate as needed.   Skin   No signs of skin breakdown; surgical incision on lower back with honeycomb dressing. No signs of drainage.  Skin remain free of breakdown, and free of infection.  Assess skin Qshift/ PRN,    Rehab Goals Patient on target to meet rehab goals: Yes Rehab Goals Revised: none *See Care Plan and progress notes for long and short-term goals.     Barriers to Discharge  Current Status/Progress Possible Resolutions Date Resolved   Physician    Medical stability        see medical progress notes      Nursing                  PT  Home environment access/layout                 OT                  SLP                SW  Discharge Planning/Teaching Needs:  Pt plans to d/c to his home with his wife and HH to provide care.  Wife will come for family education just prior to d/c 05-11-19.   Team Discussion:  Pt with lumbar radiculopathy started on steroids and antibiotics due to chest congestion that he gets regularly.  He is continent of bowel and bladder.  Pt is overall S with OT, occ min A with rolling walker.  Endurance is his main deficit, but he is stronger with adaptive equipment with S goals.  Pt is CGA with txs with rolling walker for 100'.  He needs mod A on stairs.  Revisions to Treatment Plan:  none    Continued Need for Acute Rehabilitation Level of Care: The patient requires daily medical management by a physician with specialized training in physical medicine and rehabilitation for the following conditions: Daily direction of a multidisciplinary physical rehabilitation program to ensure safe treatment  while eliciting the highest outcome that is of practical value to the patient.: Yes Daily medical management of patient stability for increased activity during participation in an intensive rehabilitation regime.: Yes Daily analysis of laboratory values and/or radiology reports with any subsequent need for medication adjustment of medical intervention for : Post surgical problems;Neurological problems   I attest that I was present, lead the team conference, and concur with the assessment and plan of the team.Team conference was held via web/ teleconference due to COVID - 19.   Nicki Gracy, Vista DeckJennifer Capps 05/10/2019, 10:57 PM

## 2019-05-10 NOTE — Progress Notes (Signed)
[facility] PHYSICAL MEDICINE & REHABILITATION PROGRESS NOTE   Subjective/Complaints: In good spirits. No new problems  ROS: Patient denies fever, rash, sore throat, blurred vision, nausea, vomiting, diarrhea, cough, shortness of breath or chest pain, joint or back pain, headache, or mood change.     Objective:   No results found. No results for input(s): WBC, HGB, HCT, PLT in the last 72 hours. No results for input(s): NA, K, CL, CO2, GLUCOSE, BUN, CREATININE, CALCIUM in the last 72 hours.  Intake/Output Summary (Last 24 hours) at 05/10/2019 0937 Last data filed at 05/10/2019 0715 Gross per 24 hour  Intake 588 ml  Output -  Net 588 ml     Physical Exam: Vital Signs Blood pressure (!) 151/85, pulse 92, temperature 98 F (36.7 C), resp. rate 18, height 5\' 10"  (1.778 m), weight 106 kg, SpO2 95 %. Constitutional: No distress . Vital signs reviewed. HEENT: EOMI, oral membranes moist Neck: supple Cardiovascular: RRR without murmur. No JVD    Respiratory: CTA Bilaterally without wheezes or rales. Normal effort    GI: BS +, non-tender, non-distended  Musculoskeletal:   General: Edema: LE edema. Neurological: He is alertand oriented to person, place, and time. Nocranial nerve deficit. Patient is alert in no acute distress. Follows full commands.UE 4/5. LE 3/5 HF, KE and 4-/5 ADF/PF. No sensory def Skin: Skin iswarm. Back incision CDI with honeycomb dressing Psychiatric: pleasant     Assessment/Plan: 1. Functional deficits secondary to lumbar stenosis with radiculopathy which require 3+ hours per day of interdisciplinary therapy in a comprehensive inpatient rehab setting.  Physiatrist is providing close team supervision and 24 hour management of active medical problems listed below.  Physiatrist and rehab team continue to assess barriers to discharge/monitor patient progress toward functional and medical goals  Care Tool:  Bathing    Body parts bathed by  patient: Right arm, Right lower leg, Left arm, Left lower leg, Chest, Face, Abdomen, Front perineal area, Buttocks, Right upper leg, Left upper leg   Body parts bathed by helper: Buttocks Body parts n/a: Front perineal area, Right upper leg, Buttocks, Left upper leg, Right lower leg, Left lower leg(Did not attempt this session)   Bathing assist Assist Level: Supervision/Verbal cueing     Upper Body Dressing/Undressing Upper body dressing   What is the patient wearing?: Pull over shirt, Orthosis Orthosis activity level: Performed by patient  Upper body assist Assist Level: Set up assist    Lower Body Dressing/Undressing Lower body dressing      What is the patient wearing?: Pants, Underwear/pull up     Lower body assist Assist for lower body dressing: Supervision/Verbal cueing     Toileting Toileting    Toileting assist Assist for toileting: Minimal Assistance - Patient > 75% Assistive Device Comment: (Urinal)   Transfers Chair/bed transfer  Transfers assist  Chair/bed transfer activity did not occur: Safety/medical concerns  Chair/bed transfer assist level: Contact Guard/Touching assist     Locomotion Ambulation   Ambulation assist   Ambulation activity did not occur: Safety/medical concerns  Assist level: Supervision/Verbal cueing Assistive device: Walker-rolling Max distance: 100'   Walk 10 feet activity   Assist  Walk 10 feet activity did not occur: Safety/medical concerns  Assist level: Supervision/Verbal cueing Assistive device: Walker-rolling   Walk 50 feet activity   Assist Walk 50 feet with 2 turns activity did not occur: Safety/medical concerns  Assist level: Supervision/Verbal cueing Assistive device: Walker-rolling    Walk 150 feet activity   Assist Walk 150  feet activity did not occur: Safety/medical concerns         Walk 10 feet on uneven surface  activity   Assist Walk 10 feet on uneven surfaces activity did not occur:  Safety/medical concerns         Wheelchair     Assist Will patient use wheelchair at discharge?: Yes Type of Wheelchair: Manual Wheelchair activity did not occur: Safety/medical concerns  Wheelchair assist level: Supervision/Verbal cueing Max wheelchair distance: 150'    Wheelchair 50 feet with 2 turns activity    Assist    Wheelchair 50 feet with 2 turns activity did not occur: Safety/medical concerns   Assist Level: Supervision/Verbal cueing   Wheelchair 150 feet activity     Assist Wheelchair 150 feet activity did not occur: Safety/medical concerns   Assist Level: Supervision/Verbal cueing     Medical Problem List and Plan: 1.Decreased functional mobilitysecondary to lumbar stenosis, spondylolisthesis with radiculopathy. Status post L2-3, 3-4 and 4-5 lumbar interbody fusion with pedicle screw fixation 04/27/2019. Lumbar corset when out of bed --Continue CIR therapies including PT, OT   -ELOS 6/19  -I will NOT see back in office 2. Antithrombotics: -pt with  DVT's of right mid femoral vein, right popliteal, right tib-peronal  -xarelto 15mg  bid to 20mg  daily, NS aware -antiplatelet therapy: N/A 3. Pain Management:Neurontin 300 mg 3 times daily,oxycodoneand Robaxin as needed  4. Mood:Provide emotional support -antipsychotic agents: N/A 5. Neuropsych: This patientiscapable of making decisions on hisown behalf. 6. Skin/Wound Care:Routine skin checks  -back wound cdi, will remove honeycomb cover 7. Fluids/Electrolytes/Nutrition:encourage PO     8.Hypertension. Norvasc 10 mg daily, lisinopril 20 mg daily. Monitor with increased mobility  -controlled 6/18 9. COPD/asthma. Continue nebulizers as directed 10.Primary immunodeficiency. Patient receivec HYQVIA50 g every 28 days with next scheduled dose was 05/04/2019. wife administered without any problems 11. GERD. Protonix 12. Hyperlipidemia.  Pravachol  13. Constipation. Moving bowels  -senokot-s bid 14. Leukocytosis:  Down to 10.3 6/15  -had productive cough over weekend, cxr unremarkable  -ua neg, ucx pending, afebrile  -suspect d/t DVT's as well  -lungs clear  -steroids dc'ed  -continue levaquin for 7 days total  LOS: 9 days A FACE TO FACE EVALUATION WAS PERFORMED  Meredith Staggers 05/10/2019, 9:37 AM

## 2019-05-10 NOTE — Progress Notes (Signed)
Social Work Discharge Note  The overall goal for the admission was met for:   Discharge location: Yes - home with his wife  Length of Stay: Yes - 10 days  Discharge activity level: Yes - supervision  Home/community participation: Yes   Services provided included: MD, RD, PT, OT, RN, Pharmacy and SW  Financial Services: Medicare and Private Insurance: medicare supplement plan G  Follow-up services arranged: Home Health: RN/PT/OT from [facility], DME: wide rolling walker; 3-in-1 from [employer] and Patient/Family has no preference for HH/DME agencies  Comments (or additional information):  Patient/Family verbalized understanding of follow-up arrangements: Yes  Individual responsible for coordination of the follow-up plan: pt and wife, Patrick Waters [phone]  Confirmed correct DME delivered: Patrick Waters 05/10/2019    Patrick Waters, Patrick Waters

## 2019-05-10 NOTE — Progress Notes (Signed)
Physical Therapy Session Note  Patient Details  Name: Patrick Waters MRN: [id number] Date of Birth: 1951-06-14  Today's Date: 05/10/2019 PT Individual Time: 0845-1000; 3825-0539 PT Individual Time Calculation (min): 75 min and 55 min  Short Term Goals: Week 1:  PT Short Term Goal 1 (Week 1): Pt will complete bed mobility with min assist. PT Short Term Goal 2 (Week 1): Pt will ambulate 50 ft with LRAD & min assist. PT Short Term Goal 3 (Week 1): Pt will negotiate 2 steps without rails with LRAD & mod assist to simulate home entrance. PT Short Term Goal 4 (Week 1): Pt will complete all transfers with min assist & LRAD. PT Short Term Goal 5 (Week 1): Pt will propel w/c 50 ft with supervision.  Skilled Therapeutic Interventions/Progress Updates:    Session 1: Pt received seated in bed, agreeable to PT session. No complaints of pain, does report some soreness in lower back, declines intervention. Semi-reclined to sitting EOB mod I with HOB elevated. Sit to stand and stand pivot transfer with RW at mod I level. Manual w/c propulsion x 150 ft with use of BUE at mod I level. Car transfer with Supervision with v/c for safe transfer technique with RW. Ambulation x 150 ft with RW and Supervision, flexed trunk posture and wide BOS. Ascend/descend one 6" step laterally with BUE HHA at mod A level. Nustep level 3 x 10 min with use of B UE/LE for global endurance training. Pt left seated in w/c in room with needs in reach at end of session.  Session 2: Pt received semi-reclined in bed, agreeable to PT session. Pt reports 7/10 pain in low back throughout session, requests pain medication from RN at end of session, declines ice pack. Bed mobility Supervision with use of HOB elevation. Stand pivot transfer with RW mod I. Ambulation x 200 ft with RW and Supervision. Ascend/descend 2 stairs with RW backwards with min A for balance and steadying RW. Pt demos improved safety with performing stairs with RW method.  Provided HEP handout for supine and seated BLE strengthening exercises with yellow theraband, reviewed therex with pt: heel slides, SAQ, quad sets, glute sets, hip abd, LAQ, marches, HS curls, resisted hip abd, hip add squeeze. Assisted pt back to bed at end of session, Supervision for bed mobility. Pt left semi-reclined in bed with needs in reach, bed alarm in place.   Therapy Documentation Precautions:  Precautions Precautions: Fall, Back Required Braces or Orthoses: Spinal Brace Spinal Brace: Lumbar corset, Applied in sitting position Restrictions Weight Bearing Restrictions: No     Therapy/Group: Individual Therapy   Excell Seltzer, PT, DPT  05/10/2019, 12:11 PM

## 2019-05-10 NOTE — Progress Notes (Signed)
Occupational Therapy Session Note  Patient Details  Name: Patrick Waters MRN: [id number] Date of Birth: [birth year]  Today's Date: 05/10/2019 OT Individual Time: 1045-1200 OT Individual Time Calculation (min): 75 min    Short Term Goals: Week 1:  OT Short Term Goal 1 (Week 1): Pt will complete 2/3 toileting tasks with steadying assist OT Short Term Goal 2 (Week 1): Pt will don pants with steadying assist using AE PRN OT Short Term Goal 3 (Week 1): Pt will stand to complete 1 grooming task with steadying assist in order to increase functional standing endurance  Skilled Therapeutic Interventions/Progress Updates:    Pt seen for OT ADL bathing/dressing session. Pt sitting up in w/c upon arrival, complaints of generalized soreness/fatigue from previous session, willing to participate as able without intervention and desiring to shower. He ambulated throughout room with RW and supervision, VCs for RW management in functional context. He doffed clothing seated on toilet using reacher to assist.  Transitioned into walk-in shower, set-up with threshold in simulation of home environment. Pt requiring VCs to recall technique for shower stall entrance technique taught in previous sessions. Following cuing, pt completed with supervision. He bathed with set-up/supervision using LH sponge to assist with LEs. VCs for lateral leans for buttock hygiene, recommending pt not stand in shower at home as he does not have grab bars in home shower.  He exited shower with close supervision. Returned to w/c to dress. Completed at overall supervision level with VCs to recall safety considerations taught in previous session including sitting to thread pants and don shirt.  RN removed honeycomb surgical dressing, PA made aware of unhealing surgical site and RN changed dressing while pt seated EOB.  Grooming tasks completed standing at sink with supervision and one UE support in order to promote standing balance/endurance.   Following extended seated rest break, he ambulated with RW throughout unit with supervision, seated rest break required following ~110ft. He then returned to room in same manner with VCs to relax shoulders and upright posture.  Pt left seated in w/c at end of session, all needs in reach.    Therapy Documentation Precautions:  Precautions Precautions: Fall Required Braces or Orthoses: Spinal Brace Spinal Brace: Lumbar corset, Applied in sitting position Restrictions Weight Bearing Restrictions: No   Therapy/Group: Individual Therapy  [name] L 05/10/2019, 7:09 AM

## 2019-05-10 NOTE — Progress Notes (Signed)
Social Work Patient ID: Patrick Waters, male   DOB: [birth year], 68 y.o.   MRN: [id number]   CSW met with pt and spoke with his wife via telephone to update them on team conference discussion and targeted d/c date of 05-11-19 after wife comes for education.  Both are pleased pt will be able to d/c.  CSW to set up [facility] therapies and order DME.  CSW will continue to follow and assist as needed.

## 2019-05-11 ENCOUNTER — Ambulatory Visit (HOSPITAL_COMMUNITY): Payer: Medicare Other | Admitting: Physical Therapy

## 2019-05-11 ENCOUNTER — Encounter (HOSPITAL_COMMUNITY): Payer: Medicare Other | Admitting: Occupational Therapy

## 2019-05-11 NOTE — Progress Notes (Signed)
Occupational Therapy Discharge Summary  Patient Details  Name: Patrick Waters MRN: [id number] Date of Birth: [birth year]   Patient has met 10 of 10 long term goals due to improved activity tolerance, improved balance, postural control, ability to compensate for deficits and improved coordination.  Patient to discharge at overall Supervision level.  Patient's care partner is independent to provide the necessary physical assistance at discharge.  Hands on caregiver training completed with pt's wife. She voices and demonstrates willing and ableness to provide needed supervision/VC assist at d/c.  Pt ambulating and completing functional transfers at overall supervision level using RW. He requires VCs for adherence to back pre-cautions during ADLs. He uses AE for LB bathing/dressing tasks.  Pt with difficulty recalling technique for shower stall transfer and recommend supervision assist with shower stall transfers to shower chair.   Recommendation:  Patient will benefit from ongoing skilled OT services in home health setting to continue to advance functional skills in the area of BADL, iADL and Reduce care partner burden.  Equipment: BSC, pt to private purchase shower chair  Reasons for discharge: treatment goals met and discharge from hospital  Patient/family agrees with progress made and goals achieved: Yes  OT Discharge Precautions/Restrictions  Precautions Required Braces or Orthoses: Spinal Brace Spinal Brace: Lumbar corset;Applied in sitting position(May ambulate to bathroom and shower without brace) Restrictions Weight Bearing Restrictions: No Vision Baseline Vision/History: Wears glasses Wears Glasses: At all times Vision Assessment?: No apparent visual deficits Perception  Perception: Within Functional Limits Praxis Praxis: Intact Cognition Overall Cognitive Status: Within Functional Limits for tasks assessed Orientation Level: Oriented X4 Sustained Attention: Appears  intact Memory: Impaired Memory Impairment: Decreased recall of new information Awareness: Appears intact Problem Solving: Appears intact Safety/Judgment: Appears intact Sensation Sensation Light Touch: Impaired Detail Peripheral sensation comments: N/T in BLE, L>R Coordination Gross Motor Movements are Fluid and Coordinated: No Fine Motor Movements are Fluid and Coordinated: Yes Coordination and Movement Description: impaired 2/2 ongoing generalized weakness, improved since eval Motor  Motor Motor: Within Functional Limits Motor - Discharge Observations: ongoing generalized weakness, improved since eval Trunk/Postural Assessment  Cervical Assessment Cervical Assessment: Exceptions to WFL(Forward head) Thoracic Assessment Thoracic Assessment: Exceptions to WFL(Kyphotic; Rounded shoulders) Lumbar Assessment Lumbar Assessment: Exceptions to WFL(Back pre-cautions) Postural Control Postural Control: Deficits on evaluation Protective Responses: delayed  Balance Balance Balance Assessed: Yes Static Sitting Balance Static Sitting - Balance Support: No upper extremity supported;Feet supported Static Sitting - Level of Assistance: 7: Independent Static Sitting - Comment/# of Minutes: Sitting EOB Dynamic Sitting Balance Dynamic Sitting - Balance Support: No upper extremity supported;Feet supported;During functional activity Dynamic Sitting - Level of Assistance: 6: Modified independent (Device/Increase time);5: Stand by assistance Sitting balance - Comments: Sitting on BSC to complete bathing task Static Standing Balance Static Standing - Balance Support: During functional activity;No upper extremity supported Static Standing - Level of Assistance: 6: Modified independent (Device/Increase time);5: Stand by assistance Dynamic Standing Balance Dynamic Standing - Balance Support: During functional activity;Right upper extremity supported;Left upper extremity supported Dynamic Standing -  Level of Assistance: 5: Stand by assistance;6: Modified independent (Device/Increase time) Dynamic Standing - Comments: Standing at RW to complete LB clothing management/toileting task Extremity/Trunk Assessment RUE Assessment RUE Assessment: Within Functional Limits LUE Assessment LUE Assessment: Within Functional Limits   Andreyah Natividad L 05/11/2019, 7:51 AM

## 2019-05-11 NOTE — Progress Notes (Signed)
Patient discharged home with family/spouse. All belongings with patient, no complications noted at this time. Audie Clear, LPN

## 2019-05-11 NOTE — Progress Notes (Signed)
Patient slept well throughout the night. Medicated x1 for c/o back pain-effective. Otherwise uneventful night.

## 2019-05-11 NOTE — Progress Notes (Signed)
[place] PHYSICAL MEDICINE & REHABILITATION PROGRESS NOTE   Subjective/Complaints: Up in bed. No new issues.   ROS: Patient denies fever, rash, sore throat, blurred vision, nausea, vomiting, diarrhea, cough, shortness of breath or chest pain, joint or back pain, headache, or mood change.    Objective:   No results found. No results for input(s): WBC, HGB, HCT, PLT in the last 72 hours. No results for input(s): NA, K, CL, CO2, GLUCOSE, BUN, CREATININE, CALCIUM in the last 72 hours.  Intake/Output Summary (Last 24 hours) at 05/11/2019 0909 Last data filed at 05/11/2019 0730 Gross per 24 hour  Intake 1309 ml  Output 2 ml  Net 1307 ml     Physical Exam: Vital Signs Blood pressure 138/69, pulse 81, temperature 97.9 F (36.6 C), temperature source Oral, resp. rate 16, height 5\' 10"  (1.778 m), weight 106 kg, SpO2 98 %. Constitutional: No distress . Vital signs reviewed. HEENT: EOMI, oral membranes moist Neck: supple Cardiovascular: RRR without murmur. No JVD    Respiratory: CTA Bilaterally without wheezes or rales. Normal effort    GI: BS +, non-tender, non-distended  Musculoskeletal:   General: Edema: LE edema tr. Neurological: He is alertand oriented to person, place, and time. Nocranial nerve deficit. Patient is alert in no acute distress. Follows full commands.UE 4/5. LE 3/5 HF, KE and 4-/5 ADF/PF. No sensory def Skin: Skin iswarm. Back incision CDI   Psychiatric: pleasant     Assessment/Plan: 1. Functional deficits secondary to lumbar stenosis with radiculopathy which require 3+ hours per day of interdisciplinary therapy in a comprehensive inpatient rehab setting.  Physiatrist is providing close team supervision and 24 hour management of active medical problems listed below.  Physiatrist and rehab team continue to assess barriers to discharge/monitor patient progress toward functional and medical goals  Care Tool:  Bathing    Body parts bathed by  patient: Right arm, Right lower leg, Left arm, Left lower leg, Chest, Face, Abdomen, Front perineal area, Buttocks, Right upper leg, Left upper leg   Body parts bathed by helper: Buttocks Body parts n/a: Front perineal area, Right upper leg, Buttocks, Left upper leg, Right lower leg, Left lower leg(Did not attempt this session)   Bathing assist Assist Level: Supervision/Verbal cueing     Upper Body Dressing/Undressing Upper body dressing   What is the patient wearing?: Pull over shirt, Orthosis Orthosis activity level: Performed by patient  Upper body assist Assist Level: Independent    Lower Body Dressing/Undressing Lower body dressing      What is the patient wearing?: Pants, Underwear/pull up     Lower body assist Assist for lower body dressing: Supervision/Verbal cueing     Toileting Toileting    Toileting assist Assist for toileting: Supervision/Verbal cueing Assistive Device Comment: (Urinal)   Transfers Chair/bed transfer  Transfers assist  Chair/bed transfer activity did not occur: Safety/medical concerns  Chair/bed transfer assist level: Independent with assistive device Chair/bed transfer assistive device: Programmer, multimedia   Ambulation assist   Ambulation activity did not occur: Safety/medical concerns  Assist level: Supervision/Verbal cueing Assistive device: Walker-rolling Max distance: 150'   Walk 10 feet activity   Assist  Walk 10 feet activity did not occur: Safety/medical concerns  Assist level: Supervision/Verbal cueing Assistive device: Walker-rolling   Walk 50 feet activity   Assist Walk 50 feet with 2 turns activity did not occur: Safety/medical concerns  Assist level: Supervision/Verbal cueing Assistive device: Walker-rolling    Walk 150 feet activity   Assist Walk  150 feet activity did not occur: Safety/medical concerns  Assist level: Supervision/Verbal cueing Assistive device: Walker-rolling    Walk 10  feet on uneven surface  activity   Assist Walk 10 feet on uneven surfaces activity did not occur: Safety/medical concerns         Wheelchair     Assist Will patient use wheelchair at discharge?: No Type of Wheelchair: Manual Wheelchair activity did not occur: Safety/medical concerns  Wheelchair assist level: Independent Max wheelchair distance: 150'    Wheelchair 50 feet with 2 turns activity    Assist    Wheelchair 50 feet with 2 turns activity did not occur: Safety/medical concerns   Assist Level: Independent   Wheelchair 150 feet activity     Assist Wheelchair 150 feet activity did not occur: Safety/medical concerns   Assist Level: Independent     Medical Problem List and Plan: 1.Decreased functional mobilitysecondary to lumbar stenosis, spondylolisthesis with radiculopathy. Status post L2-3, 3-4 and 4-5 lumbar interbody fusion with pedicle screw fixation 04/27/2019. Lumbar corset when out of bed dc home today, NS f/u  -I will NOT see back in office 2. Antithrombotics: -pt with  DVT's of right mid femoral vein, right popliteal, right tib-peronal  -xarelto 15mg  bid to 20mg  daily per standard protocol, NS aware -antiplatelet therapy: N/A 3. Pain Management:Neurontin 300 mg 3 times daily,oxycodoneand Robaxin as needed  4. Mood:Provide emotional support -antipsychotic agents: N/A 5. Neuropsych: This patientiscapable of making decisions on hisown behalf. 6. Skin/Wound Care:Routine skin checks  -back wound cdi  7. Fluids/Electrolytes/Nutrition:encourage PO     8.Hypertension. Norvasc 10 mg daily, lisinopril 20 mg daily. Monitor with increased mobility  -controlled 6/18 9. COPD/asthma. Continue nebulizers as directed 10.Primary immunodeficiency. Patient receivec HYQVIA50 g every 28 days with next scheduled dose was 05/04/2019. wife administered without any problems 11. GERD. Protonix 12.  Hyperlipidemia. Pravachol  13. Constipation. Moving bowels  -senokot-s bid 14. Leukocytosis:  Down to 10.3 6/15  -had productive cough over weekend, cxr unremarkable  -ua neg, ucx pending, afebrile  -suspect d/t DVT's as well  -lungs clear  -steroids dc'ed  -will not send home on levaquin. Can receive dose today  LOS: 10 days A FACE TO FACE EVALUATION WAS PERFORMED  Ranelle OysterZachary T Maryela Waters 05/11/2019, 9:09 AM

## 2019-05-11 NOTE — Progress Notes (Signed)
Occupational Therapy Session Note  Patient Details  Name: Patrick Waters MRN: [id number] Date of Birth: 19-May-1951  Today's Date: 05/11/2019 OT Individual Time: 1000-1030 OT Individual Time Calculation (min): 30 min    Short Term Goals: Week 1:  OT Short Term Goal 1 (Week 1): Pt will complete 2/3 toileting tasks with steadying assist OT Short Term Goal 2 (Week 1): Pt will don pants with steadying assist using AE PRN OT Short Term Goal 3 (Week 1): Pt will stand to complete 1 grooming task with steadying assist in order to increase functional standing endurance  Skilled Therapeutic Interventions/Progress Updates:    Pt seen for OT family education session. Pt sitting up in w/c upon arrival with wife present for scheduled family ed session. Pt denying pain and agreeable to tx session, eager to go home.  He ambulated throughout unit using RW with supervision. In ADL apartment, education/demonstration provided to wife for shower stall transfer using RW. Pt return demonstrated transfer with supervision. Pt's wife has already private purchased shower chair with armrests for home use.  Seated rest breaks provided on low soft surface recliner, able to complete sit>Stand with supervision from low surface.  Education provided throughout session regarding continuum of care, activity progression, back pre-cautions, ADL/IADL re-training, DME, pt's CLOF, OT/PT goals and d/c planning. All questions answered and pt and wife feeling comfortable and confident with planned d/c home today. Pt left seated in standard chair in therapy gym at end of session with hand off to PT.   Therapy Documentation Precautions:  Precautions Precautions: Fall, Back Required Braces or Orthoses: Spinal Brace Spinal Brace: Lumbar corset, Applied in sitting position Restrictions Weight Bearing Restrictions: No   Therapy/Group: Individual Therapy  [name] L 05/11/2019, 6:51 AM

## 2019-05-15 ENCOUNTER — Telehealth: Payer: Self-pay | Admitting: Physical Medicine & Rehabilitation

## 2019-05-15 NOTE — Telephone Encounter (Signed)
Kieth Brightly with [facility] needs to get verbal order for OT eval only.  Please call her at [phone].

## 2019-05-16 NOTE — Telephone Encounter (Signed)
Called penny back to approve verbal orders, she stated that he does not need any further "OT" therapy at this time.  Told her I would relay message to provider.

## 2019-05-18 ENCOUNTER — Telehealth (HOSPITAL_COMMUNITY): Payer: Self-pay | Admitting: Physical Medicine & Rehabilitation

## 2019-05-18 MED ORDER — OXYCODONE HCL 5 MG PO TABS: 5.0000 mg | ORAL_TABLET | Freq: Four times a day (QID) | ORAL | 0 refills | Status: AC | PRN

## 2019-05-18 MED ORDER — OXYCODONE HCL 5 MG PO TABS: 5.0000 mg | ORAL_TABLET | ORAL | 0 refills | Status: DC | PRN

## 2019-05-18 NOTE — Telephone Encounter (Signed)
Pt ran short of oxycodone prior to visit with NS this week. I refilled oxycodone, changed rx to q6 prn #30.

## 2019-05-28 ENCOUNTER — Other Ambulatory Visit: Payer: Self-pay | Admitting: Physical Medicine & Rehabilitation

## 2019-06-06 DIAGNOSIS — J449 Chronic obstructive pulmonary disease, unspecified: Secondary | ICD-10-CM

## 2019-06-06 DIAGNOSIS — M5416 Radiculopathy, lumbar region: Secondary | ICD-10-CM | POA: Diagnosis not present

## 2019-06-06 DIAGNOSIS — Z9181 History of falling: Secondary | ICD-10-CM

## 2019-06-06 DIAGNOSIS — M4312 Spondylolisthesis, cervical region: Secondary | ICD-10-CM

## 2019-06-06 DIAGNOSIS — G4730 Sleep apnea, unspecified: Secondary | ICD-10-CM

## 2019-06-06 DIAGNOSIS — Z7901 Long term (current) use of anticoagulants: Secondary | ICD-10-CM

## 2019-06-06 DIAGNOSIS — M4125 Other idiopathic scoliosis, thoracolumbar region: Secondary | ICD-10-CM | POA: Diagnosis not present

## 2019-06-06 DIAGNOSIS — K5900 Constipation, unspecified: Secondary | ICD-10-CM

## 2019-06-06 DIAGNOSIS — M9973 Connective tissue and disc stenosis of intervertebral foramina of lumbar region: Secondary | ICD-10-CM | POA: Diagnosis not present

## 2019-06-06 DIAGNOSIS — Z87891 Personal history of nicotine dependence: Secondary | ICD-10-CM

## 2019-06-06 DIAGNOSIS — E785 Hyperlipidemia, unspecified: Secondary | ICD-10-CM

## 2019-06-06 DIAGNOSIS — I10 Essential (primary) hypertension: Secondary | ICD-10-CM

## 2019-06-06 DIAGNOSIS — Z7951 Long term (current) use of inhaled steroids: Secondary | ICD-10-CM

## 2019-06-06 DIAGNOSIS — Z4789 Encounter for other orthopedic aftercare: Secondary | ICD-10-CM

## 2019-06-06 DIAGNOSIS — M1990 Unspecified osteoarthritis, unspecified site: Secondary | ICD-10-CM

## 2019-06-06 DIAGNOSIS — Z981 Arthrodesis status: Secondary | ICD-10-CM

## 2019-06-06 DIAGNOSIS — K219 Gastro-esophageal reflux disease without esophagitis: Secondary | ICD-10-CM

## 2020-03-27 IMAGING — CR CHEST - 2 VIEW
2 series · 3 of 3 positions shown · non-contrast
Comparison: None.

CLINICAL DATA: Greenish sputum.  Coughing.

EXAM:
CHEST - 2 VIEW

[Series 2: chest lat · 0.14mm/px · 2 of 2 slices shown]
[im 1/2]
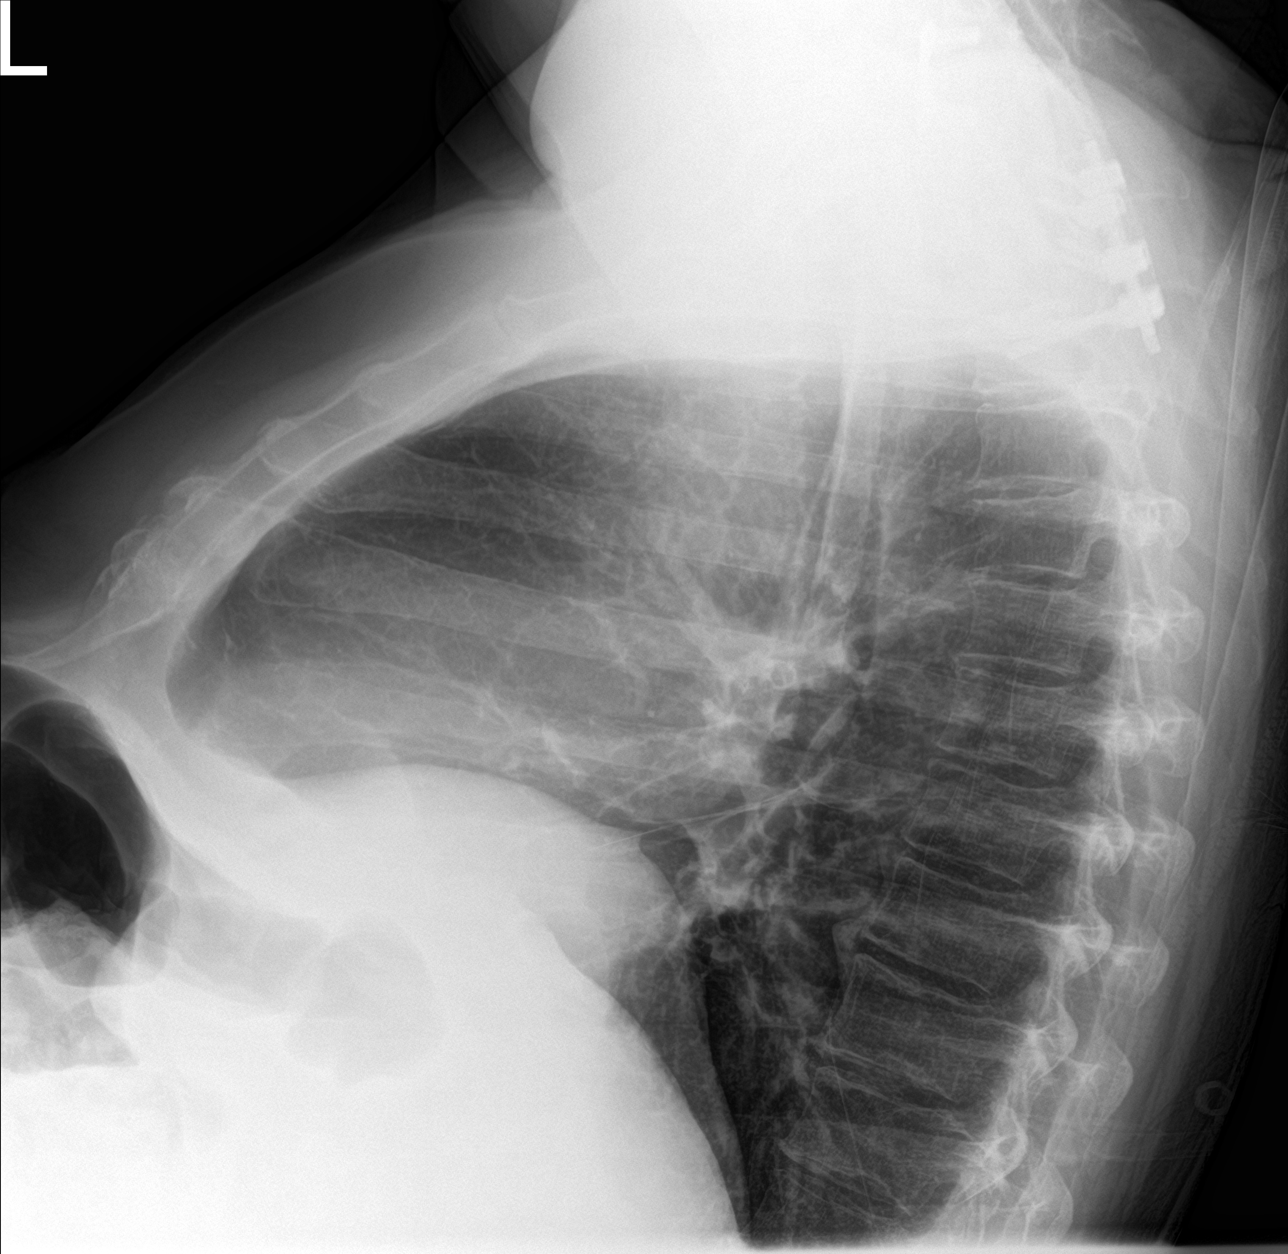
[im 2/2]
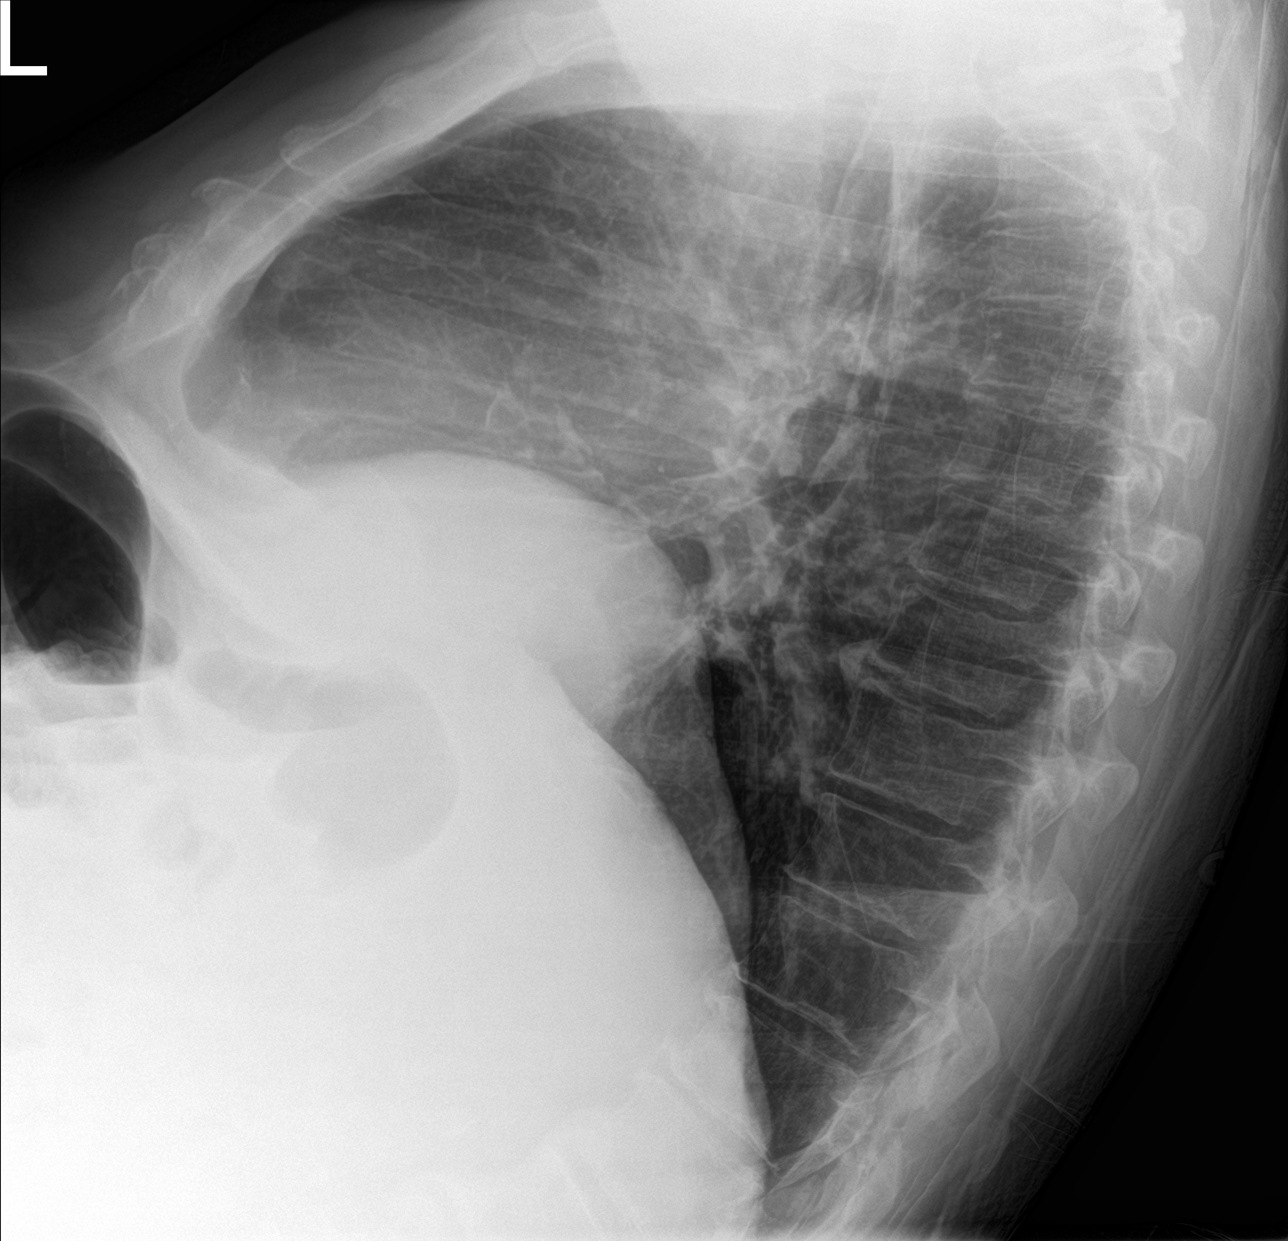

[chest ap]
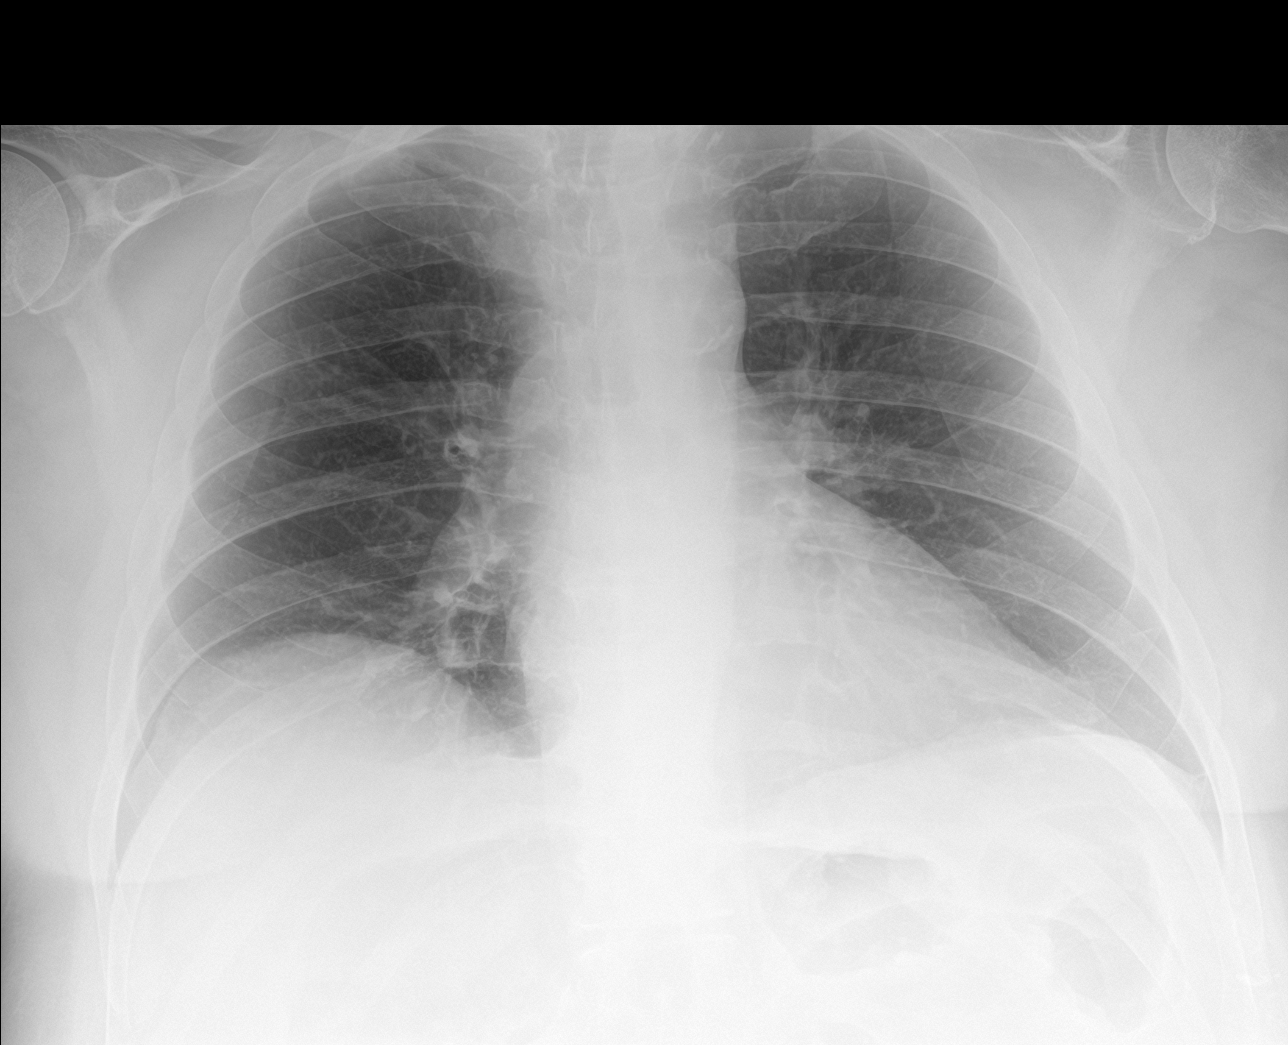

[3 of 3 positions shown; findings below may reference images not displayed]

FINDINGS: The heart size and mediastinal contours are within normal limits.
Both lungs are clear. The visualized skeletal structures are
unremarkable.
IMPRESSION: No active cardiopulmonary disease.
